# Patient Record
Sex: Female | Born: 1995 | Hispanic: Yes | Marital: Single | Smoking: Never smoker
Health system: Southern US, Community
[De-identification: ages and names within clinical notes are randomized; demographics above are authoritative.]

## PROBLEM LIST (undated history)

## (undated) DIAGNOSIS — Z87440 Personal history of urinary (tract) infections: Secondary | ICD-10-CM

## (undated) DIAGNOSIS — Z862 Personal history of diseases of the blood and blood-forming organs and certain disorders involving the immune mechanism: Secondary | ICD-10-CM

## (undated) DIAGNOSIS — G43909 Migraine, unspecified, not intractable, without status migrainosus: Secondary | ICD-10-CM

## (undated) DIAGNOSIS — Z8619 Personal history of other infectious and parasitic diseases: Secondary | ICD-10-CM

## (undated) HISTORY — DX: Personal history of other infectious and parasitic diseases: Z86.19

## (undated) HISTORY — DX: Personal history of urinary (tract) infections: Z87.440

## (undated) HISTORY — PX: OTHER SURGICAL HISTORY: SHX169

## (undated) HISTORY — DX: Migraine, unspecified, not intractable, without status migrainosus: G43.909

## (undated) HISTORY — DX: Personal history of diseases of the blood and blood-forming organs and certain disorders involving the immune mechanism: Z86.2

---

## 2016-09-01 NOTE — L&D Delivery Note (Signed)
Delivery Note At 8:03 PM a viable female was delivered via  (Presentation: ROA).  APGAR: 8, 9; weight pending.   Placenta status: Spontaneous, intact.  Cord: 3 vessels with the following complications: nuchal x1, delivered through with a somersault and easily reduced after delivery.   Anesthesia:  epidural Episiotomy:  n/a Lacerations:  Right labial, 1st degree perineal Suture Repair: 3.0 vicryl rapide Est. Blood Loss (mL):  300  Mom to postpartum.  Baby to Couplet care / Skin to Skin.  Len Blalock SNM 01/31/2017, 8:43 PM  Midwife attestation: I was gloved and present for delivery in its entirety and I agree with the above resident's note.  Julianne Handler, CNM 10:20 PM

## 2016-09-18 ENCOUNTER — Emergency Department
Admission: EM | Admit: 2016-09-18 | Discharge: 2016-09-18 | Disposition: A | Discharge status: Home / Self Care | Payer: Medicaid Other | Attending: Emergency Medicine | Admitting: Emergency Medicine

## 2016-09-18 ENCOUNTER — Encounter: Payer: Self-pay | Admitting: Emergency Medicine

## 2016-09-18 ENCOUNTER — Emergency Department: Payer: Medicaid Other

## 2016-09-18 DIAGNOSIS — Z3A20 20 weeks gestation of pregnancy: Secondary | ICD-10-CM | POA: Insufficient documentation

## 2016-09-18 DIAGNOSIS — J111 Influenza due to unidentified influenza virus with other respiratory manifestations: Secondary | ICD-10-CM | POA: Diagnosis not present

## 2016-09-18 DIAGNOSIS — O26892 Other specified pregnancy related conditions, second trimester: Secondary | ICD-10-CM | POA: Diagnosis present

## 2016-09-18 DIAGNOSIS — O98512 Other viral diseases complicating pregnancy, second trimester: Secondary | ICD-10-CM | POA: Insufficient documentation

## 2016-09-18 DIAGNOSIS — Z3492 Encounter for supervision of normal pregnancy, unspecified, second trimester: Secondary | ICD-10-CM

## 2016-09-18 LAB — COMPREHENSIVE METABOLIC PANEL
ALK PHOS: 60 U/L (ref 38–126)
ALT: 56 U/L — AB (ref 14–54)
AST: 47 U/L — ABNORMAL HIGH (ref 15–41)
Albumin: 3.7 g/dL (ref 3.5–5.0)
Anion gap: 7 (ref 5–15)
BUN: <5 mg/dL — ABNORMAL LOW (ref 6–20)
CALCIUM: 9 mg/dL (ref 8.9–10.3)
CHLORIDE: 103 mmol/L (ref 101–111)
CO2: 23 mmol/L (ref 22–32)
CREATININE: 0.56 mg/dL (ref 0.44–1.00)
Glucose, Bld: 98 mg/dL (ref 65–99)
Potassium: 4 mmol/L (ref 3.5–5.1)
Sodium: 133 mmol/L — ABNORMAL LOW (ref 135–145)
TOTAL PROTEIN: 7.8 g/dL (ref 6.5–8.1)
Total Bilirubin: <0.1 mg/dL — ABNORMAL LOW (ref 0.3–1.2)

## 2016-09-18 LAB — URINALYSIS, COMPLETE (UACMP) WITH MICROSCOPIC
Bacteria, UA: NONE SEEN
Bilirubin Urine: NEGATIVE
GLUCOSE, UA: NEGATIVE mg/dL
HGB URINE DIPSTICK: NEGATIVE
Ketones, ur: 5 mg/dL — AB
LEUKOCYTES UA: NEGATIVE
NITRITE: NEGATIVE
PROTEIN: NEGATIVE mg/dL
Specific Gravity, Urine: 1.014 (ref 1.005–1.030)
pH: 8 (ref 5.0–8.0)

## 2016-09-18 LAB — CBC
HCT: 35.8 % (ref 35.0–47.0)
Hemoglobin: 12.1 g/dL (ref 12.0–16.0)
MCH: 30 pg (ref 26.0–34.0)
MCHC: 33.8 g/dL (ref 32.0–36.0)
MCV: 88.7 fL (ref 80.0–100.0)
PLATELETS: 244 10*3/uL (ref 150–440)
RBC: 4.04 MIL/uL (ref 3.80–5.20)
RDW: 13.4 % (ref 11.5–14.5)
WBC: 7.4 10*3/uL (ref 3.6–11.0)

## 2016-09-18 LAB — INFLUENZA PANEL BY PCR (TYPE A & B)
Influenza A By PCR: POSITIVE — AB
Influenza B By PCR: NEGATIVE

## 2016-09-18 LAB — LIPASE, BLOOD: LIPASE: 24 U/L (ref 11–51)

## 2016-09-18 MED ORDER — SODIUM CHLORIDE 0.9 % IV SOLN
Freq: Once | INTRAVENOUS | Status: AC
  Administered 2016-09-18: 1000 mL via INTRAVENOUS

## 2016-09-18 MED ORDER — ACETAMINOPHEN 500 MG PO TABS
1000.0000 mg | ORAL_TABLET | Freq: Once | ORAL | Status: AC
  Administered 2016-09-18: 1000 mg via ORAL
  Filled 2016-09-18: qty 2

## 2016-09-18 MED ORDER — OSELTAMIVIR PHOSPHATE 75 MG PO CAPS: 75.0000 mg | ORAL_CAPSULE | Freq: Two times a day (BID) | ORAL | 0 refills | Status: AC

## 2016-09-18 MED ORDER — ACETAMINOPHEN 325 MG PO TABS: 650.0000 mg | ORAL_TABLET | ORAL | 2 refills | Status: DC | PRN

## 2016-09-18 MED ORDER — ONDANSETRON HCL 4 MG/2ML IJ SOLN
4.0000 mg | Freq: Once | INTRAMUSCULAR | Status: AC
  Administered 2016-09-18: 4 mg via INTRAVENOUS
  Filled 2016-09-18: qty 2

## 2016-09-18 MED ORDER — OSELTAMIVIR PHOSPHATE 75 MG PO CAPS
75.0000 mg | ORAL_CAPSULE | Freq: Once | ORAL | Status: AC
  Administered 2016-09-18: 75 mg via ORAL
  Filled 2016-09-18: qty 1

## 2016-09-18 MED ORDER — ONDANSETRON 4 MG PO TBDP: 4.0000 mg | ORAL_TABLET | Freq: Three times a day (TID) | ORAL | 0 refills | Status: DC | PRN

## 2016-09-18 NOTE — ED Provider Notes (Signed)
[facility] Emergency Department Provider Note        Time seen: ----------------------------------------- 9:35 AM on 09/18/2016 -----------------------------------------    I have reviewed the triage vital signs and the nursing notes.   HISTORY  Chief Complaint Fever    HPI Tracey Ray is a 21 y.o. female who presents to the ER with fevers, chills, body aches, headache, abdominal pain and vomiting that started yesterday. Patient denies any Case with the pregnancy. Patient states she's 4 and half months pregnant, nothing makes her symptoms better or worse. She denies any diarrhea or other complaints.   History reviewed. No pertinent past medical history.  There are no active problems to display for this patient.   History reviewed. No pertinent surgical history.  Allergies Patient has no known allergies.  Social History Social History  Substance Use Topics  . Smoking status: Never Smoker  . Smokeless tobacco: Never Used  . Alcohol use No    Review of Systems Constitutional: Positive fevers, chills, body aches Cardiovascular: Negative for chest pain. Respiratory: Negative for shortness of breath. Gastrointestinal:Positive for abdominal pain, vomiting Genitourinary: Negative for dysuria. Musculoskeletal: Negative for back pain. Skin: Negative for rash. Neurological: Positive for headache  10-point ROS otherwise negative.  ____________________________________________   PHYSICAL EXAM:  VITAL SIGNS: ED Triage Vitals  Enc Vitals Group     BP 09/18/16 0923 113/87     Pulse Rate 09/18/16 0923 (!) 140     Resp 09/18/16 0923 18     Temp 09/18/16 0923 (!) 101.2 F (38.4 C)     Temp Source 09/18/16 0923 Oral     SpO2 09/18/16 0923 98 %     Weight 09/18/16 0924 129 lb (58.5 kg)     Height 09/18/16 0924 5' (1.524 m)     Head Circumference --      Peak Flow --      Pain Score 09/18/16 0924 5     Pain Loc --      Pain  Edu? --      Excl. in [place]? --     Constitutional: Alert and oriented. No distress Eyes: Conjunctivae are normal. PERRL. Normal extraocular movements. ENT   Head: Normocephalic and atraumatic.   Nose: No congestion/rhinnorhea.   Mouth/Throat: Mucous membranes are moist.   Neck: No stridor. Cardiovascular: Rapid rate, regular rhythm. No murmurs, rubs, or gallops. Respiratory: Normal respiratory effort without tachypnea nor retractions. Breath sounds are clear and equal bilaterally. No wheezes/rales/rhonchi. Gastrointestinal: Soft and nontender. Normal bowel sounds Musculoskeletal: Nontender with normal range of motion in all extremities. No lower extremity tenderness nor edema. Neurologic:  Normal speech and language. No gross focal neurologic deficits are appreciated.  Skin:  Skin is warm, dry and intact. No rash noted. Psychiatric: Mood and affect are normal. Speech and behavior are normal.  ____________________________________________  ED COURSE:  Pertinent labs & imaging results that were available during my care of the patient were reviewed by me and considered in my medical decision making (see chart for details).  patient is in no distress, likely influenza. We will assess with labs and imaging. She'll receive IV fluids and antiemetics.  Procedures ____________________________________________   LABS (pertinent positives/negatives)  Labs Reviewed  COMPREHENSIVE METABOLIC PANEL - Abnormal; Notable for the following:       Result Value   Sodium 133 (*)    BUN <5 (*)    AST 47 (*)    ALT 56 (*)    Total Bilirubin <0.1 (*)  All other components within normal limits  URINALYSIS, COMPLETE (UACMP) WITH MICROSCOPIC - Abnormal; Notable for the following:    Color, Urine YELLOW (*)    APPearance CLEAR (*)    Ketones, ur 5 (*)    Squamous Epithelial / LPF 0-5 (*)    All other components within normal limits  INFLUENZA PANEL BY PCR (TYPE A & B) - Abnormal; Notable  for the following:    Influenza A By PCR POSITIVE (*)    All other components within normal limits  LIPASE, BLOOD  CBC    RADIOLOGY Images were viewed by me  Ultrasound OB limited IMPRESSION: Single live intrauterine gestation as above.  No acute abnormalities.  This exam is performed on an emergent basis and does not comprehensively evaluate fetal size, dating, or anatomy; follow-up complete OB US should be considered if further fetal assessment is warranted. ____________________________________________  FINAL ASSESSMENT AND PLAN  Influenza, second trimester pregnancy  Plan: Patient with labs and imaging as dictated above. Patient is in no acute distress, measuring right at 20 weeks and 6 days. She doesn't have vaginal bleeding or abdominal pain at this time. She does have flu symptoms with a normal ultrasound. She was started on Tamiflu, she is stable for outpatient follow-up.   Earleen Newport, MD   Note: This note was generated in part or whole with voice recognition software. Voice recognition is usually quite accurate but there are transcription errors that can and very often do occur. I apologize for any typographical errors that were not detected and corrected.     Earleen Newport, MD 09/18/16 1045

## 2016-09-18 NOTE — ED Notes (Signed)
Patient transported to Ultrasound 

## 2016-09-18 NOTE — ED Triage Notes (Signed)
Pt is 4.5 months pregnant. Has had fever, body aches, headache, and RLQ pain since yesterday. Denies complications with pregnancy. Last took tylenol last night.

## 2016-09-19 ENCOUNTER — Other Ambulatory Visit (HOSPITAL_COMMUNITY): Payer: Self-pay | Admitting: Primary Care

## 2016-09-19 ENCOUNTER — Other Ambulatory Visit: Payer: Self-pay | Admitting: Primary Care

## 2016-09-19 DIAGNOSIS — Z3482 Encounter for supervision of other normal pregnancy, second trimester: Secondary | ICD-10-CM

## 2016-10-01 LAB — OB RESULTS CONSOLE ABO/RH: RH Type: POSITIVE

## 2016-10-01 LAB — OB RESULTS CONSOLE HIV ANTIBODY (ROUTINE TESTING): HIV: NONREACTIVE

## 2016-10-01 LAB — OB RESULTS CONSOLE GC/CHLAMYDIA
CHLAMYDIA, DNA PROBE: NEGATIVE
GC PROBE AMP, GENITAL: NEGATIVE

## 2016-10-01 LAB — OB RESULTS CONSOLE RPR: RPR: NONREACTIVE

## 2016-10-01 LAB — OB RESULTS CONSOLE HGB/HCT, BLOOD
HCT: 37 %
Hemoglobin: 12.5 g/dL

## 2016-10-01 LAB — OB RESULTS CONSOLE PLATELET COUNT: Platelets: 278 10*3/uL

## 2016-10-01 LAB — OB RESULTS CONSOLE ANTIBODY SCREEN: Antibody Screen: NEGATIVE

## 2016-10-01 LAB — OB RESULTS CONSOLE HEPATITIS B SURFACE ANTIGEN: HEP B S AG: NEGATIVE

## 2016-10-01 LAB — OB RESULTS CONSOLE RUBELLA ANTIBODY, IGM: RUBELLA: NON-IMMUNE/NOT IMMUNE

## 2016-10-01 LAB — OB RESULTS CONSOLE VARICELLA ZOSTER ANTIBODY, IGG: VARICELLA IGG: IMMUNE

## 2016-10-02 ENCOUNTER — Other Ambulatory Visit (HOSPITAL_COMMUNITY): Payer: Self-pay | Admitting: Primary Care

## 2016-10-02 DIAGNOSIS — Z3A23 23 weeks gestation of pregnancy: Secondary | ICD-10-CM

## 2016-10-02 DIAGNOSIS — Z3689 Encounter for other specified antenatal screening: Secondary | ICD-10-CM

## 2016-10-10 ENCOUNTER — Ambulatory Visit (HOSPITAL_COMMUNITY)
Admission: RE | Admit: 2016-10-10 | Discharge: 2016-10-10 | Disposition: A | Discharge status: Home / Self Care | Payer: Medicaid Other | Source: Ambulatory Visit | Attending: Internal Medicine | Admitting: Internal Medicine

## 2016-10-10 ENCOUNTER — Other Ambulatory Visit (HOSPITAL_COMMUNITY): Payer: Self-pay | Admitting: Primary Care

## 2016-10-10 DIAGNOSIS — Z3A23 23 weeks gestation of pregnancy: Secondary | ICD-10-CM

## 2016-10-10 DIAGNOSIS — Z3689 Encounter for other specified antenatal screening: Secondary | ICD-10-CM

## 2016-10-10 DIAGNOSIS — Z363 Encounter for antenatal screening for malformations: Secondary | ICD-10-CM | POA: Diagnosis present

## 2016-11-17 ENCOUNTER — Encounter: Payer: Self-pay | Admitting: *Deleted

## 2016-11-17 ENCOUNTER — Ambulatory Visit (INDEPENDENT_AMBULATORY_CARE_PROVIDER_SITE_OTHER): Payer: Medicaid Other | Admitting: Family Medicine

## 2016-11-17 ENCOUNTER — Encounter: Payer: Self-pay | Admitting: Family Medicine

## 2016-11-17 DIAGNOSIS — Z3490 Encounter for supervision of normal pregnancy, unspecified, unspecified trimester: Secondary | ICD-10-CM

## 2016-11-17 DIAGNOSIS — Z23 Encounter for immunization: Secondary | ICD-10-CM

## 2016-11-17 DIAGNOSIS — Z2839 Other underimmunization status: Secondary | ICD-10-CM

## 2016-11-17 DIAGNOSIS — O0933 Supervision of pregnancy with insufficient antenatal care, third trimester: Secondary | ICD-10-CM

## 2016-11-17 DIAGNOSIS — O9989 Other specified diseases and conditions complicating pregnancy, childbirth and the puerperium: Secondary | ICD-10-CM

## 2016-11-17 DIAGNOSIS — O09899 Supervision of other high risk pregnancies, unspecified trimester: Secondary | ICD-10-CM | POA: Insufficient documentation

## 2016-11-17 DIAGNOSIS — Z283 Underimmunization status: Secondary | ICD-10-CM | POA: Insufficient documentation

## 2016-11-17 DIAGNOSIS — O0930 Supervision of pregnancy with insufficient antenatal care, unspecified trimester: Secondary | ICD-10-CM | POA: Insufficient documentation

## 2016-11-17 HISTORY — DX: Encounter for supervision of normal pregnancy, unspecified, unspecified trimester: Z34.90

## 2016-11-17 NOTE — Progress Notes (Signed)
New OB

## 2016-11-17 NOTE — Patient Instructions (Signed)
 Third Trimester of Pregnancy The third trimester is from week 28 through week 40 (months 7 through 9). The third trimester is a time when the unborn baby (fetus) is growing rapidly. At the end of the ninth month, the fetus is about 20 inches in length and weighs 6-10 pounds. Body changes during your third trimester Your body will continue to go through many changes during pregnancy. The changes vary from woman to woman. During the third trimester:  Your weight will continue to increase. You can expect to gain 25-35 pounds (11-16 kg) by the end of the pregnancy.  You may begin to get stretch marks on your hips, abdomen, and breasts.  You may urinate more often because the fetus is moving lower into your pelvis and pressing on your bladder.  You may develop or continue to have heartburn. This is caused by increased hormones that slow down muscles in the digestive tract.  You may develop or continue to have constipation because increased hormones slow digestion and cause the muscles that push waste through your intestines to relax.  You may develop hemorrhoids. These are swollen veins (varicose veins) in the rectum that can itch or be painful.  You may develop swollen, bulging veins (varicose veins) in your legs.  You may have increased body aches in the pelvis, back, or thighs. This is due to weight gain and increased hormones that are relaxing your joints.  You may have changes in your hair. These can include thickening of your hair, rapid growth, and changes in texture. Some women also have hair loss during or after pregnancy, or hair that feels dry or thin. Your hair will most likely return to normal after your baby is born.  Your breasts will continue to grow and they will continue to become tender. A yellow fluid (colostrum) may leak from your breasts. This is the first milk you are producing for your baby.  Your belly button may stick out.  You may notice more swelling in your  hands, face, or ankles.  You may have increased tingling or numbness in your hands, arms, and legs. The skin on your belly may also feel numb.  You may feel short of breath because of your expanding uterus.  You may have more problems sleeping. This can be caused by the size of your belly, increased need to urinate, and an increase in your body's metabolism.  You may notice the fetus "dropping," or moving lower in your abdomen (lightening).  You may have increased vaginal discharge.  You may notice your joints feel loose and you may have pain around your pelvic bone.  What to expect at prenatal visits You will have prenatal exams every 2 weeks until week 36. Then you will have weekly prenatal exams. During a routine prenatal visit:  You will be weighed to make sure you and the baby are growing normally.  Your blood pressure will be taken.  Your abdomen will be measured to track your baby's growth.  The fetal heartbeat will be listened to.  Any test results from the previous visit will be discussed.  You may have a cervical check near your due date to see if your cervix has softened or thinned (effaced).  You will be tested for Group B streptococcus. This happens between 35 and 37 weeks.  Your health care provider may ask you:  What your birth plan is.  How you are feeling.  If you are feeling the baby move.  If you have   had any abnormal symptoms, such as leaking fluid, bleeding, severe headaches, or abdominal cramping.  If you are using any tobacco products, including cigarettes, chewing tobacco, and electronic cigarettes.  If you have any questions.  Other tests or screenings that may be performed during your third trimester include:  Blood tests that check for low iron levels (anemia).  Fetal testing to check the health, activity level, and growth of the fetus. Testing is done if you have certain medical conditions or if there are problems during the  pregnancy.  Nonstress test (NST). This test checks the health of your baby to make sure there are no signs of problems, such as the baby not getting enough oxygen. During this test, a belt is placed around your belly. The baby is made to move, and its heart rate is monitored during movement.  What is false labor? False labor is a condition in which you feel small, irregular tightenings of the muscles in the womb (contractions) that usually go away with rest, changing position, or drinking water. These are called Braxton Hicks contractions. Contractions may last for hours, days, or even weeks before true labor sets in. If contractions come at regular intervals, become more frequent, increase in intensity, or become painful, you should see your health care provider. What are the signs of labor?  Abdominal cramps.  Regular contractions that start at 10 minutes apart and become stronger and more frequent with time.  Contractions that start on the top of the uterus and spread down to the lower abdomen and back.  Increased pelvic pressure and dull back pain.  A watery or bloody mucus discharge that comes from the vagina.  Leaking of amniotic fluid. This is also known as your "water breaking." It could be a slow trickle or a gush. Let your health care provider know if it has a color or strange odor. If you have any of these signs, call your health care provider right away, even if it is before your due date. Follow these instructions at home: Medicines  Follow your health care provider's instructions regarding medicine use. Specific medicines may be either safe or unsafe to take during pregnancy.  Take a prenatal vitamin that contains at least 600 micrograms (mcg) of folic acid.  If you develop constipation, try taking a stool softener if your health care provider approves. Eating and drinking  Eat a balanced diet that includes fresh fruits and vegetables, whole grains, good sources of protein  such as meat, eggs, or tofu, and low-fat dairy. Your health care provider will help you determine the amount of weight gain that is right for you.  Avoid raw meat and uncooked cheese. These carry germs that can cause birth defects in the baby.  If you have low calcium intake from food, talk to your health care provider about whether you should take a daily calcium supplement.  Eat four or five small meals rather than three large meals a day.  Limit foods that are high in fat and processed sugars, such as fried and sweet foods.  To prevent constipation: ? Drink enough fluid to keep your urine clear or pale yellow. ? Eat foods that are high in fiber, such as fresh fruits and vegetables, whole grains, and beans. Activity  Exercise only as directed by your health care provider. Most women can continue their usual exercise routine during pregnancy. Try to exercise for 30 minutes at least 5 days a week. Stop exercising if you experience uterine contractions.  Avoid   heavy lifting.  Do not exercise in extreme heat or humidity, or at high altitudes.  Wear low-heel, comfortable shoes.  Practice good posture.  You may continue to have sex unless your health care provider tells you otherwise. Relieving pain and discomfort  Take frequent breaks and rest with your legs elevated if you have leg cramps or low back pain.  Take warm sitz baths to soothe any pain or discomfort caused by hemorrhoids. Use hemorrhoid cream if your health care provider approves.  Wear a good support bra to prevent discomfort from breast tenderness.  If you develop varicose veins: ? Wear support pantyhose or compression stockings as told by your healthcare provider. ? Elevate your feet for 15 minutes, 3-4 times a day. Prenatal care  Write down your questions. Take them to your prenatal visits.  Keep all your prenatal visits as told by your health care provider. This is important. Safety  Wear your seat belt at  all times when driving.  Make a list of emergency phone numbers, including numbers for family, friends, the hospital, and police and fire departments. General instructions  Avoid cat litter boxes and soil used by cats. These carry germs that can cause birth defects in the baby. If you have a cat, ask someone to clean the litter box for you.  Do not travel far distances unless it is absolutely necessary and only with the approval of your health care provider.  Do not use hot tubs, steam rooms, or saunas.  Do not drink alcohol.  Do not use any products that contain nicotine or tobacco, such as cigarettes and e-cigarettes. If you need help quitting, ask your health care provider.  Do not use any medicinal herbs or unprescribed drugs. These chemicals affect the formation and growth of the baby.  Do not douche or use tampons or scented sanitary pads.  Do not cross your legs for long periods of time.  To prepare for the arrival of your baby: ? Take prenatal classes to understand, practice, and ask questions about labor and delivery. ? Make a trial run to the hospital. ? Visit the hospital and tour the maternity area. ? Arrange for maternity or paternity leave through employers. ? Arrange for family and friends to take care of pets while you are in the hospital. ? Purchase a rear-facing car seat and make sure you know how to install it in your car. ? Pack your hospital bag. ? Prepare the baby's nursery. Make sure to remove all pillows and stuffed animals from the baby's crib to prevent suffocation.  Visit your dentist if you have not gone during your pregnancy. Use a soft toothbrush to brush your teeth and be gentle when you floss. Contact a health care provider if:  You are unsure if you are in labor or if your water has broken.  You become dizzy.  You have mild pelvic cramps, pelvic pressure, or nagging pain in your abdominal area.  You have lower back pain.  You have persistent  nausea, vomiting, or diarrhea.  You have an unusual or bad smelling vaginal discharge.  You have pain when you urinate. Get help right away if:  Your water breaks before 37 weeks.  You have regular contractions less than 5 minutes apart before 37 weeks.  You have a fever.  You are leaking fluid from your vagina.  You have spotting or bleeding from your vagina.  You have severe abdominal pain or cramping.  You have rapid weight loss or weight   gain.  You have shortness of breath with chest pain.  You notice sudden or extreme swelling of your face, hands, ankles, feet, or legs.  Your baby makes fewer than 10 movements in 2 hours.  You have severe headaches that do not go away when you take medicine.  You have vision changes. Summary  The third trimester is from week 28 through week 40, months 7 through 9. The third trimester is a time when the unborn baby (fetus) is growing rapidly.  During the third trimester, your discomfort may increase as you and your baby continue to gain weight. You may have abdominal, leg, and back pain, sleeping problems, and an increased need to urinate.  During the third trimester your breasts will keep growing and they will continue to become tender. A yellow fluid (colostrum) may leak from your breasts. This is the first milk you are producing for your baby.  False labor is a condition in which you feel small, irregular tightenings of the muscles in the womb (contractions) that eventually go away. These are called Braxton Hicks contractions. Contractions may last for hours, days, or even weeks before true labor sets in.  Signs of labor can include: abdominal cramps; regular contractions that start at 10 minutes apart and become stronger and more frequent with time; watery or bloody mucus discharge that comes from the vagina; increased pelvic pressure and dull back pain; and leaking of amniotic fluid. This information is not intended to replace advice  given to you by your health care provider. Make sure you discuss any questions you have with your health care provider. Document Released: 08/12/2001 Document Revised: 01/24/2016 Document Reviewed: 10/19/2012 [employer] Interactive Patient Education  2017 [employer]   Breastfeeding Deciding to breastfeed is one of the best choices you can make for you and your baby. A change in hormones during pregnancy causes your breast tissue to grow and increases the number and size of your milk ducts. These hormones also allow proteins, sugars, and fats from your blood supply to make breast milk in your milk-producing glands. Hormones prevent breast milk from being released before your baby is born as well as prompt milk flow after birth. Once breastfeeding has begun, thoughts of your baby, as well as his or her sucking or crying, can stimulate the release of milk from your milk-producing glands. Benefits of breastfeeding For Your Baby  Your first milk (colostrum) helps your baby's digestive system function better.  There are antibodies in your milk that help your baby fight off infections.  Your baby has a lower incidence of asthma, allergies, and sudden infant death syndrome.  The nutrients in breast milk are better for your baby than infant formulas and are designed uniquely for your baby's needs.  Breast milk improves your baby's brain development.  Your baby is less likely to develop other conditions, such as childhood obesity, asthma, or type 2 diabetes mellitus.  For You  Breastfeeding helps to create a very special bond between you and your baby.  Breastfeeding is convenient. Breast milk is always available at the correct temperature and costs nothing.  Breastfeeding helps to burn calories and helps you lose the weight gained during pregnancy.  Breastfeeding makes your uterus contract to its prepregnancy size faster and slows bleeding (lochia) after you give birth.  Breastfeeding helps  to lower your risk of developing type 2 diabetes mellitus, osteoporosis, and breast or ovarian cancer later in life.  Signs that your baby is hungry Early Signs of Hunger    Increased alertness or activity.  Stretching.  Movement of the head from side to side.  Movement of the head and opening of the mouth when the corner of the mouth or cheek is stroked (rooting).  Increased sucking sounds, smacking lips, cooing, sighing, or squeaking.  Hand-to-mouth movements.  Increased sucking of fingers or hands.  Late Signs of Hunger  Fussing.  Intermittent crying.  Extreme Signs of Hunger Signs of extreme hunger will require calming and consoling before your baby will be able to breastfeed successfully. Do not wait for the following signs of extreme hunger to occur before you initiate breastfeeding:  Restlessness.  A loud, strong cry.  Screaming.  Breastfeeding basics Breastfeeding Initiation  Find a comfortable place to sit or lie down, with your neck and back well supported.  Place a pillow or rolled up blanket under your baby to bring him or her to the level of your breast (if you are seated). Nursing pillows are specially designed to help support your arms and your baby while you breastfeed.  Make sure that your baby's abdomen is facing your abdomen.  Gently massage your breast. With your fingertips, massage from your chest wall toward your nipple in a circular motion. This encourages milk flow. You may need to continue this action during the feeding if your milk flows slowly.  Support your breast with 4 fingers underneath and your thumb above your nipple. Make sure your fingers are well away from your nipple and your baby's mouth.  Stroke your baby's lips gently with your finger or nipple.  When your baby's mouth is open wide enough, quickly bring your baby to your breast, placing your entire nipple and as much of the colored area around your nipple (areola) as possible into  your baby's mouth. ? More areola should be visible above your baby's upper lip than below the lower lip. ? Your baby's tongue should be between his or her lower gum and your breast.  Ensure that your baby's mouth is correctly positioned around your nipple (latched). Your baby's lips should create a seal on your breast and be turned out (everted).  It is common for your baby to suck about 2-3 minutes in order to start the flow of breast milk.  Latching Teaching your baby how to latch on to your breast properly is very important. An improper latch can cause nipple pain and decreased milk supply for you and poor weight gain in your baby. Also, if your baby is not latched onto your nipple properly, he or she may swallow some air during feeding. This can make your baby fussy. Burping your baby when you switch breasts during the feeding can help to get rid of the air. However, teaching your baby to latch on properly is still the best way to prevent fussiness from swallowing air while breastfeeding. Signs that your baby has successfully latched on to your nipple:  Silent tugging or silent sucking, without causing you pain.  Swallowing heard between every 3-4 sucks.  Muscle movement above and in front of his or her ears while sucking.  Signs that your baby has not successfully latched on to nipple:  Sucking sounds or smacking sounds from your baby while breastfeeding.  Nipple pain.  If you think your baby has not latched on correctly, slip your finger into the corner of your baby's mouth to break the suction and place it between your baby's gums. Attempt breastfeeding initiation again. Signs of Successful Breastfeeding Signs from your baby:  A   gradual decrease in the number of sucks or complete cessation of sucking.  Falling asleep.  Relaxation of his or her body.  Retention of a small amount of milk in his or her mouth.  Letting go of your breast by himself or herself.  Signs from  you:  Breasts that have increased in firmness, weight, and size 1-3 hours after feeding.  Breasts that are softer immediately after breastfeeding.  Increased milk volume, as well as a change in milk consistency and color by the fifth day of breastfeeding.  Nipples that are not sore, cracked, or bleeding.  Signs That Your Baby is Getting Enough Milk  Wetting at least 1-2 diapers during the first 24 hours after birth.  Wetting at least 5-6 diapers every 24 hours for the first week after birth. The urine should be clear or pale yellow by 5 days after birth.  Wetting 6-8 diapers every 24 hours as your baby continues to grow and develop.  At least 3 stools in a 24-hour period by age 5 days. The stool should be soft and yellow.  At least 3 stools in a 24-hour period by age 7 days. The stool should be seedy and yellow.  No loss of weight greater than 10% of birth weight during the first 3 days of age.  Average weight gain of 4-7 ounces (113-198 g) per week after age 4 days.  Consistent daily weight gain by age 5 days, without weight loss after the age of 2 weeks.  After a feeding, your baby may spit up a small amount. This is common. Breastfeeding frequency and duration Frequent feeding will help you make more milk and can prevent sore nipples and breast engorgement. Breastfeed when you feel the need to reduce the fullness of your breasts or when your baby shows signs of hunger. This is called "breastfeeding on demand." Avoid introducing a pacifier to your baby while you are working to establish breastfeeding (the first 4-6 weeks after your baby is born). After this time you may choose to use a pacifier. Research has shown that pacifier use during the first year of a baby's life decreases the risk of sudden infant death syndrome (SIDS). Allow your baby to feed on each breast as long as he or she wants. Breastfeed until your baby is finished feeding. When your baby unlatches or falls asleep  while feeding from the first breast, offer the second breast. Because newborns are often sleepy in the first few weeks of life, you may need to awaken your baby to get him or her to feed. Breastfeeding times will vary from baby to baby. However, the following rules can serve as a guide to help you ensure that your baby is properly fed:  Newborns (babies 4 weeks of age or younger) may breastfeed every 1-3 hours.  Newborns should not go longer than 3 hours during the day or 5 hours during the night without breastfeeding.  You should breastfeed your baby a minimum of 8 times in a 24-hour period until you begin to introduce solid foods to your baby at around 6 months of age.  Breast milk pumping Pumping and storing breast milk allows you to ensure that your baby is exclusively fed your breast milk, even at times when you are unable to breastfeed. This is especially important if you are going back to work while you are still breastfeeding or when you are not able to be present during feedings. Your lactation consultant can give you guidelines on how   long it is safe to store breast milk. A breast pump is a machine that allows you to pump milk from your breast into a sterile bottle. The pumped breast milk can then be stored in a refrigerator or freezer. Some breast pumps are operated by hand, while others use electricity. Ask your lactation consultant which type will work best for you. Breast pumps can be purchased, but some hospitals and breastfeeding support groups lease breast pumps on a monthly basis. A lactation consultant can teach you how to hand express breast milk, if you prefer not to use a pump. Caring for your breasts while you breastfeed Nipples can become dry, cracked, and sore while breastfeeding. The following recommendations can help keep your breasts moisturized and healthy:  Avoid using soap on your nipples.  Wear a supportive bra. Although not required, special nursing bras and tank  tops are designed to allow access to your breasts for breastfeeding without taking off your entire bra or top. Avoid wearing underwire-style bras or extremely tight bras.  Air dry your nipples for 3-4minutes after each feeding.  Use only cotton bra pads to absorb leaked breast milk. Leaking of breast milk between feedings is normal.  Use lanolin on your nipples after breastfeeding. Lanolin helps to maintain your skin's normal moisture barrier. If you use pure lanolin, you do not need to wash it off before feeding your baby again. Pure lanolin is not toxic to your baby. You may also hand express a few drops of breast milk and gently massage that milk into your nipples and allow the milk to air dry.  In the first few weeks after giving birth, some women experience extremely full breasts (engorgement). Engorgement can make your breasts feel heavy, warm, and tender to the touch. Engorgement peaks within 3-5 days after you give birth. The following recommendations can help ease engorgement:  Completely empty your breasts while breastfeeding or pumping. You may want to start by applying warm, moist heat (in the shower or with warm water-soaked hand towels) just before feeding or pumping. This increases circulation and helps the milk flow. If your baby does not completely empty your breasts while breastfeeding, pump any extra milk after he or she is finished.  Wear a snug bra (nursing or regular) or tank top for 1-2 days to signal your body to slightly decrease milk production.  Apply ice packs to your breasts, unless this is too uncomfortable for you.  Make sure that your baby is latched on and positioned properly while breastfeeding.  If engorgement persists after 48 hours of following these recommendations, contact your health care provider or a lactation consultant. Overall health care recommendations while breastfeeding  Eat healthy foods. Alternate between meals and snacks, eating 3 of each per  day. Because what you eat affects your breast milk, some of the foods may make your baby more irritable than usual. Avoid eating these foods if you are sure that they are negatively affecting your baby.  Drink milk, fruit juice, and water to satisfy your thirst (about 10 glasses a day).  Rest often, relax, and continue to take your prenatal vitamins to prevent fatigue, stress, and anemia.  Continue breast self-awareness checks.  Avoid chewing and smoking tobacco. Chemicals from cigarettes that pass into breast milk and exposure to secondhand smoke may harm your baby.  Avoid alcohol and drug use, including marijuana. Some medicines that may be harmful to your baby can pass through breast milk. It is important to ask your health care   provider before taking any medicine, including all over-the-counter and prescription medicine as well as vitamin and herbal supplements. It is possible to become pregnant while breastfeeding. If birth control is desired, ask your health care provider about options that will be safe for your baby. Contact a health care provider if:  You feel like you want to stop breastfeeding or have become frustrated with breastfeeding.  You have painful breasts or nipples.  Your nipples are cracked or bleeding.  Your breasts are red, tender, or warm.  You have a swollen area on either breast.  You have a fever or chills.  You have nausea or vomiting.  You have drainage other than breast milk from your nipples.  Your breasts do not become full before feedings by the fifth day after you give birth.  You feel sad and depressed.  Your baby is too sleepy to eat well.  Your baby is having trouble sleeping.  Your baby is wetting less than 3 diapers in a 24-hour period.  Your baby has less than 3 stools in a 24-hour period.  Your baby's skin or the white part of his or her eyes becomes yellow.  Your baby is not gaining weight by 5 days of age. Get help right away  if:  Your baby is overly tired (lethargic) and does not want to wake up and feed.  Your baby develops an unexplained fever. This information is not intended to replace advice given to you by your health care provider. Make sure you discuss any questions you have with your health care provider. Document Released: 08/18/2005 Document Revised: 01/30/2016 Document Reviewed: 02/09/2013 Elsevier Interactive Patient Education  2017 [employer] Inc[employer]  

## 2016-11-17 NOTE — Progress Notes (Signed)
   PRENATAL VISIT NOTE  Subjective:  Tracey Ray is a 21 y.o. G1P0 at 66w1dbeing seen today for transferring prenatal care from [facility].  She is currently monitored for the following issues for this low-risk pregnancy and has Supervision of normal pregnancy, antepartum and Rubella non-immune status, antepartum on her problem list.  Patient reports no complaints.  Contractions: Not present. Vag. Bleeding: None.  Movement: Present. Denies leaking of fluid.   The following portions of the patient's history were reviewed and updated as appropriate: allergies, current medications, past family history, past medical history, past social history, past surgical history and problem list. Problem list updated.  Objective:   Vitals:   11/17/16 1503  BP: 107/72  Pulse: 97  Weight: 144 lb (65.3 kg)    Fetal Status: Fetal Heart Rate (bpm): 160 Fundal Height: 29 cm Movement: Present     General:  Alert, oriented and cooperative. Patient is in no acute distress.  Skin: Skin is warm and dry. No rash noted.   Cardiovascular: Normal heart rate noted  Respiratory: Normal respiratory effort, no problems with respiration noted  Abdomen: Soft, gravid, appropriate for gestational age. Pain/Pressure: Absent     Pelvic:  Cervical exam deferred        Extremities: Normal range of motion.  Edema: Trace  Mental Status: Normal mood and affect. Normal behavior. Normal judgment and thought content.   Assessment and Plan:  Pregnancy: G1P0 at 248w1d1. Encounter for supervision of normal pregnancy, antepartum, unspecified gravidity States she has had 28 wk labs. - Tdap vaccine greater than or equal to 7yo IM  2. Rubella non-immune status, antepartum Needs MMR pp  Preterm labor symptoms and general obstetric precautions including but not limited to vaginal bleeding, contractions, leaking of fluid and fetal movement were reviewed in detail with the patient. Please refer to After  Visit Summary for other counseling recommendations.  Return in 2 weeks (on 12/01/2016).   TaDonnamae JudeMD

## 2016-11-19 ENCOUNTER — Encounter: Payer: Self-pay | Admitting: *Deleted

## 2016-12-01 ENCOUNTER — Encounter: Payer: Medicaid Other | Admitting: Obstetrics and Gynecology

## 2016-12-03 ENCOUNTER — Ambulatory Visit (INDEPENDENT_AMBULATORY_CARE_PROVIDER_SITE_OTHER): Payer: Medicaid Other | Admitting: Obstetrics & Gynecology

## 2016-12-03 VITALS — BP 102/69 | HR 96 | Wt 148.0 lb

## 2016-12-03 DIAGNOSIS — Z3403 Encounter for supervision of normal first pregnancy, third trimester: Secondary | ICD-10-CM

## 2016-12-03 DIAGNOSIS — Z3490 Encounter for supervision of normal pregnancy, unspecified, unspecified trimester: Secondary | ICD-10-CM

## 2016-12-03 NOTE — Progress Notes (Signed)
   PRENATAL VISIT NOTE  Subjective:  Tracey Ray is a 21 y.o. G1P0 at [redacted]w[redacted]d being seen today for ongoing prenatal care.  She is currently monitored for the following issues for this low-risk pregnancy and has Supervision of normal pregnancy, antepartum and Rubella non-immune status, antepartum on her problem list.  Patient reports no complaints.  Contractions: Not present. Vag. Bleeding: None.  Movement: Present. Denies leaking of fluid.   The following portions of the patient's history were reviewed and updated as appropriate: allergies, current medications, past family history, past medical history, past social history, past surgical history and problem list. Problem list updated.  Objective:   Vitals:   12/03/16 1035  BP: 102/69  Pulse: 96  Weight: 148 lb (67.1 kg)    Fetal Status: Fetal Heart Rate (bpm): 145   Movement: Present     General:  Alert, oriented and cooperative. Patient is in no acute distress.  Skin: Skin is warm and dry. No rash noted.   Cardiovascular: Normal heart rate noted  Respiratory: Normal respiratory effort, no problems with respiration noted  Abdomen: Soft, gravid, appropriate for gestational age. Pain/Pressure: Absent     Pelvic:  Cervical exam deferred        Extremities: Normal range of motion.  Edema: None  Mental Status: Normal mood and affect. Normal behavior. Normal judgment and thought content.   Assessment and Plan:  Pregnancy: G1P0 at [redacted]w[redacted]d  1. Encounter for supervision of normal pregnancy, antepartum, unspecified gravidity   Preterm labor symptoms and general obstetric precautions including but not limited to vaginal bleeding, contractions, leaking of fluid and fetal movement were reviewed in detail with the patient. Please refer to After Visit Summary for other counseling recommendations.  No Follow-up on file.   Emily Filbert, MD

## 2016-12-16 ENCOUNTER — Ambulatory Visit (INDEPENDENT_AMBULATORY_CARE_PROVIDER_SITE_OTHER): Payer: Medicaid Other | Admitting: Obstetrics and Gynecology

## 2016-12-16 VITALS — BP 102/71 | HR 78 | Wt 150.0 lb

## 2016-12-16 DIAGNOSIS — O09899 Supervision of other high risk pregnancies, unspecified trimester: Secondary | ICD-10-CM

## 2016-12-16 DIAGNOSIS — Z3403 Encounter for supervision of normal first pregnancy, third trimester: Secondary | ICD-10-CM

## 2016-12-16 DIAGNOSIS — Z3400 Encounter for supervision of normal first pregnancy, unspecified trimester: Secondary | ICD-10-CM

## 2016-12-16 DIAGNOSIS — Z283 Underimmunization status: Secondary | ICD-10-CM

## 2016-12-16 DIAGNOSIS — O9989 Other specified diseases and conditions complicating pregnancy, childbirth and the puerperium: Secondary | ICD-10-CM

## 2016-12-16 MED ORDER — COMPLETENATE 29-1 MG PO CHEW: 1.0000 | CHEWABLE_TABLET | Freq: Every day | ORAL | 12 refills | Status: DC

## 2016-12-16 NOTE — Progress Notes (Signed)
   PRENATAL VISIT NOTE  Subjective:  Tracey Ray is a 21 y.o. G1P0 at [redacted]w[redacted]d being seen today for ongoing prenatal care.  She is currently monitored for the following issues for this low-risk pregnancy and has Supervision of normal pregnancy, antepartum and Rubella non-immune status, antepartum on her problem list.  Patient reports seasonal allergies.  Contractions: Not present. Vag. Bleeding: None.  Movement: Present. Denies leaking of fluid.   The following portions of the patient's history were reviewed and updated as appropriate: allergies, current medications, past family history, past medical history, past social history, past surgical history and problem list. Problem list updated.  Objective:   Vitals:   12/16/16 1041  BP: 102/71  Pulse: 78  Weight: 150 lb (68 kg)    Fetal Status: Fetal Heart Rate (bpm): 143 Fundal Height: 33 cm Movement: Present     General:  Alert, oriented and cooperative. Patient is in no acute distress.  Skin: Skin is warm and dry. No rash noted.   Cardiovascular: Normal heart rate noted  Respiratory: Normal respiratory effort, no problems with respiration noted  Abdomen: Soft, gravid, appropriate for gestational age. Pain/Pressure: Present     Pelvic:  Cervical exam deferred        Extremities: Normal range of motion.  Edema: Trace  Mental Status: Normal mood and affect. Normal behavior. Normal judgment and thought content.   Assessment and Plan:  Pregnancy: G1P0 at [redacted]w[redacted]d  1. Supervision of normal first pregnancy, antepartum Advised patient to use OTC allergy medications- Zytec, Allegra Patient has yet to decide on pediatrician She is interested in circumcision and understands that there is a fee for the procedure  2. Rubella non-immune status, antepartum Will offer pp  Preterm labor symptoms and general obstetric precautions including but not limited to vaginal bleeding, contractions, leaking of fluid and fetal movement were  reviewed in detail with the patient. Please refer to After Visit Summary for other counseling recommendations.  Return in about 2 weeks (around 12/30/2016) for [place].   Mora Bellman, MD

## 2016-12-31 ENCOUNTER — Encounter: Payer: Medicaid Other | Admitting: Family Medicine

## 2016-12-31 ENCOUNTER — Ambulatory Visit (INDEPENDENT_AMBULATORY_CARE_PROVIDER_SITE_OTHER): Payer: Medicaid Other | Admitting: Family Medicine

## 2016-12-31 ENCOUNTER — Other Ambulatory Visit (HOSPITAL_COMMUNITY)
Admission: RE | Admit: 2016-12-31 | Discharge: 2016-12-31 | Disposition: A | Discharge status: Home / Self Care | Payer: Medicaid Other | Source: Ambulatory Visit | Attending: Family Medicine | Admitting: Family Medicine

## 2016-12-31 VITALS — BP 128/76 | HR 125 | Wt 151.0 lb

## 2016-12-31 DIAGNOSIS — Z3403 Encounter for supervision of normal first pregnancy, third trimester: Secondary | ICD-10-CM | POA: Diagnosis present

## 2016-12-31 LAB — OB RESULTS CONSOLE GBS: STREP GROUP B AG: NEGATIVE

## 2016-12-31 NOTE — Patient Instructions (Signed)
Pain Relief During Labor and Delivery Many things can cause pain during labor and delivery, including:  Pressure on bones and ligaments due to the baby moving through the pelvis.  Stretching of tissues due to the baby moving through the birth canal.  Muscle tension due to anxiety or nervousness.  The uterus tightening (contracting) and relaxing to help move the baby. There are many ways to deal with the pain of labor and delivery. They include:  Taking prenatal classes. Taking these classes helps you know what to expect during your baby's birth. What you learn will increase your confidence and decrease your anxiety.  Practicing relaxation techniques or doing relaxing activities, such as:  Focused breathing.  Meditation.  Visualization.  Aroma therapy.  Listening to your favorite music.  Hypnosis.  Taking a warm shower or bath (hydrotherapy). This may:  Provide comfort and relaxation.  Lessen your perception of pain.  Decrease the amount of pain medicine needed.  Decrease the length of labor.  Getting a massage or counterpressure on your back.  Applying warm packs or ice packs.  Changing positions often, moving around, or using a birthing ball.  Getting:  Pain medicine through an IV or injection into a muscle.  Pain medicine inserted into your spinal column.  Injections of sterile water just under the skin on your lower back (intradermal injections).  Laughing gas (nitrous oxide). Discuss your pain control options with your health care provider during your prenatal visits. Explore the options offered by your hospital or birth center. What kinds of medicine are available? There are two kinds of medicines that can be used to relieve pain during labor and delivery:  Analgesics. These medicines decrease pain without causing you to lose feeling or the ability to move your muscles.  Anesthetics. These medicines block feeling in the body and can decrease your  ability to move freely. Both of these kinds of medicine can cause minor side effects, such as nausea, trouble concentrating, and sleepiness. They can also decrease the baby's heart rate before birth and affect the baby's breathing rate after birth. For this reason, health care providers are careful about when and how much medicine is given. What are specific medicines and procedures that provide pain relief?  Local Anesthetics  Local anesthetics are used to numb a small area of the body. They may be used along with another kind of anesthetic or used to numb the nerves of the vagina, cervix, and perineum during the second stage of labor. General Anesthetics  General anesthetics cause you to lose consciousness so you do not feel pain. They are usually only used for an emergency cesarean delivery. General anesthetics are given through an IV tube and a mask. Pudendal Block  A pudendal block is a form of local anesthetic. It may be used to relieve the pain associated with pushing or stretching of the perineum at the time of delivery or to further numb the perineum. A pudendal block is done by injecting numbing medicine through the vaginal wall into a nerve in the pelvis. Epidural Analgesia  Epidural analgesia is given through a flexible IV catheter that is inserted into the lower back. Numbing medicine is delivered continuously to the area near your spinal column nerves (epidural space). After having this type of analgesia, you may be able to move your legs but you most likely will not be able to walk. Depending on the amount of medicine given, you may lose all feeling in the lower half of your body, or  you may retain some level of sensation, including the urge to push. Epidural analgesia can be used to provide pain relief for a vaginal birth. Spinal Block  A spinal block is similar to epidural analgesia, but the medicine is injected into the spinal fluid instead of the epidural space. A spinal block is only  given once. It starts to relieve pain quickly, but the pain relief lasts only 1-6 hours. Spinal blocks can be used for cesarean deliveries. Combined Spinal-Epidural (CSE) Block  A CSE block combines the effects of a spinal block and epidural analgesia. The spinal block works quickly to block all pain. The epidural analgesia provides continuous pain relief, even after the effects of the spinal block have worn off. This information is not intended to replace advice given to you by your health care provider. Make sure you discuss any questions you have with your health care provider. Document Released: 12/04/2008 Document Revised: 01/25/2016 Document Reviewed: 01/09/2016 Elsevier Interactive Patient Education  2017 [employer].

## 2016-12-31 NOTE — Progress Notes (Signed)
   PRENATAL VISIT NOTE  Subjective:  Tracey Ray is a 21 y.o. G1P0 at 13w3dbeing seen today for ongoing prenatal care.  She is currently monitored for the following issues for this low-risk pregnancy and has Supervision of normal pregnancy, antepartum and Rubella non-immune status, antepartum on her problem list.  Patient reports no complaints.  Contractions: Not present. Vag. Bleeding: None.  Movement: Present. Denies leaking of fluid.   HR elevated- denies lightheadedness. Reports occasional dizziness with changes in position. Denies SOB.   The following portions of the patient's history were reviewed and updated as appropriate: allergies, current medications, past family history, past medical history, past social history, past surgical history and problem list. Problem list updated.  Objective:   Vitals:   12/31/16 1048  BP: 128/76  Pulse: (!) 125  Weight: 151 lb (68.5 kg)    Fetal Status: Fetal Heart Rate (bpm): 146   Movement: Present     General:  Alert, oriented and cooperative. Patient is in no acute distress.  Skin: Skin is warm and dry. No rash noted.   Cardiovascular: Normal heart rate noted  Respiratory: Normal respiratory effort, no problems with respiration noted  Abdomen: Soft, gravid, appropriate for gestational age. Pain/Pressure: Present     Pelvic:  Cervical exam deferred        Extremities: Normal range of motion.  Edema: Trace  Mental Status: Normal mood and affect. Normal behavior. Normal judgment and thought content.   Assessment and Plan:  Pregnancy: G1P0 at 38w3d1. Encounter for supervision of normal first pregnancy in third trimester - Routine care - Culture, beta strep (group b only) - Cervicovaginal ancillary only  2. Rubella- give MMR pp  3. Maternal tachycardia - Initially 125, repeat 110. ausculatory exam wnl - Recommended increasing water intake  - Reviewed return precautions, patient voiced udnerstandning  Preterm  labor symptoms and general obstetric precautions including but not limited to vaginal bleeding, contractions, leaking of fluid and fetal movement were reviewed in detail with the patient. Please refer to After Visit Summary for other counseling recommendations .  Return in about 1 week (around 01/07/2017) for Routine prenatal care.   KiCaren MacadamMD

## 2017-01-02 LAB — CERVICOVAGINAL ANCILLARY ONLY
CHLAMYDIA, DNA PROBE: NEGATIVE
NEISSERIA GONORRHEA: NEGATIVE

## 2017-01-04 LAB — CULTURE, BETA STREP (GROUP B ONLY): STREP GP B CULTURE: NEGATIVE

## 2017-01-09 ENCOUNTER — Encounter: Payer: Self-pay | Admitting: Obstetrics and Gynecology

## 2017-01-09 ENCOUNTER — Ambulatory Visit (INDEPENDENT_AMBULATORY_CARE_PROVIDER_SITE_OTHER): Payer: Medicaid Other | Admitting: Obstetrics and Gynecology

## 2017-01-09 VITALS — BP 104/68 | HR 90 | Wt 153.0 lb

## 2017-01-09 DIAGNOSIS — Z2839 Other underimmunization status: Secondary | ICD-10-CM

## 2017-01-09 DIAGNOSIS — Z3400 Encounter for supervision of normal first pregnancy, unspecified trimester: Secondary | ICD-10-CM

## 2017-01-09 DIAGNOSIS — O9989 Other specified diseases and conditions complicating pregnancy, childbirth and the puerperium: Secondary | ICD-10-CM

## 2017-01-09 DIAGNOSIS — Z283 Underimmunization status: Secondary | ICD-10-CM

## 2017-01-09 DIAGNOSIS — Z3403 Encounter for supervision of normal first pregnancy, third trimester: Secondary | ICD-10-CM

## 2017-01-09 NOTE — Progress Notes (Signed)
   PRENATAL VISIT NOTE  Subjective:  Tracey Ray is a 21 y.o. G1P0 at [redacted]w[redacted]d being seen today for ongoing prenatal care.  She is currently monitored for the following issues for this low-risk pregnancy and has Supervision of normal pregnancy, antepartum and Rubella non-immune status, antepartum on her problem list.  Patient reports no complaints.  Contractions: Not present. Vag. Bleeding: None.  Movement: Present. Denies leaking of fluid.   The following portions of the patient's history were reviewed and updated as appropriate: allergies, current medications, past family history, past medical history, past social history, past surgical history and problem list. Problem list updated.  Objective:   Vitals:   01/09/17 1111  BP: 104/68  Pulse: 90  Weight: 153 lb (69.4 kg)    Fetal Status: Fetal Heart Rate (bpm): 154 Fundal Height: 36 cm Movement: Present     General:  Alert, oriented and cooperative. Patient is in no acute distress.  Skin: Skin is warm and dry. No rash noted.   Cardiovascular: Normal heart rate noted  Respiratory: Normal respiratory effort, no problems with respiration noted  Abdomen: Soft, gravid, appropriate for gestational age. Pain/Pressure: Present     Pelvic:  Cervical exam deferred        Extremities: Normal range of motion.  Edema: Mild pitting, slight indentation  Mental Status: Normal mood and affect. Normal behavior. Normal judgment and thought content.   Assessment and Plan:  Pregnancy: G1P0 at [redacted]w[redacted]d  1. Supervision of normal first pregnancy, antepartum Patient is doing well without complaints Answered questions regarding delivery and hospitalization Reviewed neg culture results Considering Nexplanon  2. Rubella non-immune status, antepartum Will offer pp   Preterm labor symptoms and general obstetric precautions including but not limited to vaginal bleeding, contractions, leaking of fluid and fetal movement were reviewed in detail  with the patient. Please refer to After Visit Summary for other counseling recommendations.  Return in about 1 week (around 01/16/2017) for [place].   Bashir Marchetti, Vickii Chafe, MD

## 2017-01-14 ENCOUNTER — Ambulatory Visit (INDEPENDENT_AMBULATORY_CARE_PROVIDER_SITE_OTHER): Payer: Medicaid Other | Admitting: Family Medicine

## 2017-01-14 VITALS — BP 107/73 | HR 88 | Wt 156.0 lb

## 2017-01-14 DIAGNOSIS — Z3400 Encounter for supervision of normal first pregnancy, unspecified trimester: Secondary | ICD-10-CM

## 2017-01-14 DIAGNOSIS — Z3403 Encounter for supervision of normal first pregnancy, third trimester: Secondary | ICD-10-CM

## 2017-01-14 DIAGNOSIS — O9989 Other specified diseases and conditions complicating pregnancy, childbirth and the puerperium: Secondary | ICD-10-CM

## 2017-01-14 DIAGNOSIS — Z283 Underimmunization status: Secondary | ICD-10-CM

## 2017-01-14 DIAGNOSIS — O09899 Supervision of other high risk pregnancies, unspecified trimester: Secondary | ICD-10-CM

## 2017-01-14 NOTE — Progress Notes (Signed)
   PRENATAL VISIT NOTE  Subjective:  Tracey Ray is a 21 y.o. G1P0 at 59w3dbeing seen today for ongoing prenatal care.  She is currently monitored for the following issues for this low-risk pregnancy and has Supervision of normal pregnancy, antepartum and Rubella non-immune status, antepartum on her problem list.  Patient reports cramping.  Contractions: Not present. Vag. Bleeding: None.  Movement: Present. Denies leaking of fluid.   The following portions of the patient's history were reviewed and updated as appropriate: allergies, current medications, past family history, past medical history, past social history, past surgical history and problem list. Problem list updated.  Objective:   Vitals:   01/14/17 1616  BP: 107/73  Pulse: 88  Weight: 156 lb (70.8 kg)    Fetal Status: Fetal Heart Rate (bpm): 139 Fundal Height: 37 cm Movement: Present     General:  Alert, oriented and cooperative. Patient is in no acute distress.  Skin: Skin is warm and dry. No rash noted.   Cardiovascular: Normal heart rate noted  Respiratory: Normal respiratory effort, no problems with respiration noted  Abdomen: Soft, gravid, appropriate for gestational age. Pain/Pressure: Present     Pelvic:  Cervical exam performed        Extremities: Normal range of motion.  Edema: Trace  Mental Status: Normal mood and affect. Normal behavior. Normal judgment and thought content.   Assessment and Plan:  Pregnancy: G1P0 at 349w3d1. Supervision of normal first pregnancy, antepartum UTD Reviewed labor precautions Discussed IOl at 41 weeks if she does not deliver before  2. Rubella non-immune status, antepartum MMR pp  Term labor symptoms and general obstetric precautions including but not limited to vaginal bleeding, contractions, leaking of fluid and fetal movement were reviewed in detail with the patient. Please refer to After Visit Summary for other counseling recommendations.  Return in  about 1 week (around 01/21/2017) for Routine prenatal care.   KiCaren MacadamMD

## 2017-01-14 NOTE — Patient Instructions (Signed)
Pain Relief During Labor and Delivery Many things can cause pain during labor and delivery, including:  Pressure on bones and ligaments due to the baby moving through the pelvis.  Stretching of tissues due to the baby moving through the birth canal.  Muscle tension due to anxiety or nervousness.  The uterus tightening (contracting) and relaxing to help move the baby. There are many ways to deal with the pain of labor and delivery. They include:  Taking prenatal classes. Taking these classes helps you know what to expect during your baby's birth. What you learn will increase your confidence and decrease your anxiety.  Practicing relaxation techniques or doing relaxing activities, such as:  Focused breathing.  Meditation.  Visualization.  Aroma therapy.  Listening to your favorite music.  Hypnosis.  Taking a warm shower or bath (hydrotherapy). This may:  Provide comfort and relaxation.  Lessen your perception of pain.  Decrease the amount of pain medicine needed.  Decrease the length of labor.  Getting a massage or counterpressure on your back.  Applying warm packs or ice packs.  Changing positions often, moving around, or using a birthing ball.  Getting:  Pain medicine through an IV or injection into a muscle.  Pain medicine inserted into your spinal column.  Injections of sterile water just under the skin on your lower back (intradermal injections).  Laughing gas (nitrous oxide). Discuss your pain control options with your health care provider during your prenatal visits. Explore the options offered by your hospital or birth center. What kinds of medicine are available? There are two kinds of medicines that can be used to relieve pain during labor and delivery:  Analgesics. These medicines decrease pain without causing you to lose feeling or the ability to move your muscles.  Anesthetics. These medicines block feeling in the body and can decrease your  ability to move freely. Both of these kinds of medicine can cause minor side effects, such as nausea, trouble concentrating, and sleepiness. They can also decrease the baby's heart rate before birth and affect the baby's breathing rate after birth. For this reason, health care providers are careful about when and how much medicine is given. What are specific medicines and procedures that provide pain relief?  Local Anesthetics  Local anesthetics are used to numb a small area of the body. They may be used along with another kind of anesthetic or used to numb the nerves of the vagina, cervix, and perineum during the second stage of labor. General Anesthetics  General anesthetics cause you to lose consciousness so you do not feel pain. They are usually only used for an emergency cesarean delivery. General anesthetics are given through an IV tube and a mask. Pudendal Block  A pudendal block is a form of local anesthetic. It may be used to relieve the pain associated with pushing or stretching of the perineum at the time of delivery or to further numb the perineum. A pudendal block is done by injecting numbing medicine through the vaginal wall into a nerve in the pelvis. Epidural Analgesia  Epidural analgesia is given through a flexible IV catheter that is inserted into the lower back. Numbing medicine is delivered continuously to the area near your spinal column nerves (epidural space). After having this type of analgesia, you may be able to move your legs but you most likely will not be able to walk. Depending on the amount of medicine given, you may lose all feeling in the lower half of your body, or  you may retain some level of sensation, including the urge to push. Epidural analgesia can be used to provide pain relief for a vaginal birth. Spinal Block  A spinal block is similar to epidural analgesia, but the medicine is injected into the spinal fluid instead of the epidural space. A spinal block is only  given once. It starts to relieve pain quickly, but the pain relief lasts only 1-6 hours. Spinal blocks can be used for cesarean deliveries. Combined Spinal-Epidural (CSE) Block  A CSE block combines the effects of a spinal block and epidural analgesia. The spinal block works quickly to block all pain. The epidural analgesia provides continuous pain relief, even after the effects of the spinal block have worn off. This information is not intended to replace advice given to you by your health care provider. Make sure you discuss any questions you have with your health care provider. Document Released: 12/04/2008 Document Revised: 01/25/2016 Document Reviewed: 01/09/2016 Elsevier Interactive Patient Education  2017 [employer].

## 2017-01-22 ENCOUNTER — Ambulatory Visit (INDEPENDENT_AMBULATORY_CARE_PROVIDER_SITE_OTHER): Payer: Medicaid Other | Admitting: Obstetrics and Gynecology

## 2017-01-22 ENCOUNTER — Encounter: Payer: Medicaid Other | Admitting: Obstetrics and Gynecology

## 2017-01-22 VITALS — BP 111/74 | HR 102 | Wt 159.0 lb

## 2017-01-22 DIAGNOSIS — Z3403 Encounter for supervision of normal first pregnancy, third trimester: Secondary | ICD-10-CM

## 2017-01-22 DIAGNOSIS — Z283 Underimmunization status: Secondary | ICD-10-CM

## 2017-01-22 DIAGNOSIS — Z3400 Encounter for supervision of normal first pregnancy, unspecified trimester: Secondary | ICD-10-CM

## 2017-01-22 DIAGNOSIS — O09899 Supervision of other high risk pregnancies, unspecified trimester: Secondary | ICD-10-CM

## 2017-01-22 DIAGNOSIS — O9989 Other specified diseases and conditions complicating pregnancy, childbirth and the puerperium: Principal | ICD-10-CM

## 2017-01-22 NOTE — Progress Notes (Signed)
   PRENATAL VISIT NOTE  Subjective:  Tracey Ray is a 21 y.o. G1P0 at [redacted]w[redacted]d being seen today for ongoing prenatal care.  She is currently monitored for the following issues for this low-risk pregnancy and has Supervision of normal pregnancy, antepartum and Rubella non-immune status, antepartum on her problem list.  Patient reports no complaints.  Contractions: Not present. Vag. Bleeding: None.  Movement: Present. Denies leaking of fluid.   The following portions of the patient's history were reviewed and updated as appropriate: allergies, current medications, past family history, past medical history, past social history, past surgical history and problem list. Problem list updated.  Objective:   Vitals:   01/22/17 0920  BP: 111/74  Pulse: (!) 102  Weight: 159 lb (72.1 kg)    Fetal Status: Fetal Heart Rate (bpm): 140 Fundal Height: 38 cm Movement: Present     General:  Alert, oriented and cooperative. Patient is in no acute distress.  Skin: Skin is warm and dry. No rash noted.   Cardiovascular: Normal heart rate noted  Respiratory: Normal respiratory effort, no problems with respiration noted  Abdomen: Soft, gravid, appropriate for gestational age. Pain/Pressure: Present     Pelvic:  Cervical exam deferred        Extremities: Normal range of motion.  Edema: Moderate pitting, indentation subsides rapidly  Mental Status: Normal mood and affect. Normal behavior. Normal judgment and thought content.   Assessment and Plan:  Pregnancy: G1P0 at [redacted]w[redacted]d  1. Rubella non-immune status, antepartum Will offer pp  2. Supervision of normal first pregnancy, antepartum Patient is doing well without complaints. A few occasional contractions Discussed IOL at 41 week if no SOL  Term labor symptoms and general obstetric precautions including but not limited to vaginal bleeding, contractions, leaking of fluid and fetal movement were reviewed in detail with the patient. Please refer  to After Visit Summary for other counseling recommendations.  Return in about 1 week (around 01/29/2017) for [place].   Mora Bellman, MD

## 2017-01-27 ENCOUNTER — Encounter: Payer: Medicaid Other | Admitting: Family Medicine

## 2017-01-30 ENCOUNTER — Inpatient Hospital Stay (HOSPITAL_COMMUNITY)
Admission: AD | Admit: 2017-01-30 | Discharge: 2017-02-02 | DRG: 775 | Disposition: A | Discharge status: Home / Self Care | Payer: Medicaid Other | Source: Ambulatory Visit | Attending: Obstetrics and Gynecology | Admitting: Obstetrics and Gynecology

## 2017-01-30 ENCOUNTER — Encounter (HOSPITAL_COMMUNITY): Payer: Self-pay

## 2017-01-30 ENCOUNTER — Ambulatory Visit (INDEPENDENT_AMBULATORY_CARE_PROVIDER_SITE_OTHER): Payer: Medicaid Other | Admitting: Obstetrics and Gynecology

## 2017-01-30 ENCOUNTER — Telehealth (HOSPITAL_COMMUNITY): Payer: Self-pay | Admitting: *Deleted

## 2017-01-30 VITALS — BP 108/71 | HR 78 | Wt 160.0 lb

## 2017-01-30 DIAGNOSIS — Z3A39 39 weeks gestation of pregnancy: Secondary | ICD-10-CM | POA: Diagnosis not present

## 2017-01-30 DIAGNOSIS — Z2839 Other underimmunization status: Secondary | ICD-10-CM

## 2017-01-30 DIAGNOSIS — O9989 Other specified diseases and conditions complicating pregnancy, childbirth and the puerperium: Secondary | ICD-10-CM

## 2017-01-30 DIAGNOSIS — Z283 Underimmunization status: Secondary | ICD-10-CM

## 2017-01-30 DIAGNOSIS — Z3400 Encounter for supervision of normal first pregnancy, unspecified trimester: Secondary | ICD-10-CM

## 2017-01-30 DIAGNOSIS — O4292 Full-term premature rupture of membranes, unspecified as to length of time between rupture and onset of labor: Secondary | ICD-10-CM | POA: Diagnosis present

## 2017-01-30 DIAGNOSIS — Z3483 Encounter for supervision of other normal pregnancy, third trimester: Secondary | ICD-10-CM

## 2017-01-30 DIAGNOSIS — Z3493 Encounter for supervision of normal pregnancy, unspecified, third trimester: Secondary | ICD-10-CM | POA: Diagnosis present

## 2017-01-30 LAB — CBC
HCT: 34.8 % — ABNORMAL LOW (ref 36.0–46.0)
Hemoglobin: 10.8 g/dL — ABNORMAL LOW (ref 12.0–15.0)
MCH: 24.8 pg — ABNORMAL LOW (ref 26.0–34.0)
MCHC: 31 g/dL (ref 30.0–36.0)
MCV: 80 fL (ref 78.0–100.0)
PLATELETS: 283 10*3/uL (ref 150–400)
RBC: 4.35 MIL/uL (ref 3.87–5.11)
RDW: 17.9 % — ABNORMAL HIGH (ref 11.5–15.5)
WBC: 9.7 10*3/uL (ref 4.0–10.5)

## 2017-01-30 LAB — POCT FERN TEST: POCT Fern Test: POSITIVE

## 2017-01-30 MED ORDER — FENTANYL CITRATE (PF) 100 MCG/2ML IJ SOLN
100.0000 ug | INTRAMUSCULAR | Status: DC | PRN
  Administered 2017-01-31 (×3): 100 ug via INTRAVENOUS
  Filled 2017-01-30 (×4): qty 2

## 2017-01-30 MED ORDER — LACTATED RINGERS IV SOLN
INTRAVENOUS | Status: DC
  Administered 2017-01-30 – 2017-01-31 (×3): via INTRAVENOUS

## 2017-01-30 MED ORDER — LACTATED RINGERS IV SOLN
500.0000 mL | INTRAVENOUS | Status: DC | PRN
  Administered 2017-01-31: 500 mL via INTRAVENOUS

## 2017-01-30 MED ORDER — SOD CITRATE-CITRIC ACID 500-334 MG/5ML PO SOLN: 30.0000 mL | ORAL | Status: DC | PRN

## 2017-01-30 MED ORDER — ONDANSETRON HCL 4 MG/2ML IJ SOLN
4.0000 mg | Freq: Four times a day (QID) | INTRAMUSCULAR | Status: DC | PRN
  Administered 2017-01-31: 4 mg via INTRAVENOUS
  Filled 2017-01-30: qty 2

## 2017-01-30 MED ORDER — LIDOCAINE HCL (PF) 1 % IJ SOLN
30.0000 mL | INTRAMUSCULAR | Status: DC | PRN
  Filled 2017-01-30: qty 30

## 2017-01-30 MED ORDER — OXYTOCIN BOLUS FROM INFUSION
500.0000 mL | Freq: Once | INTRAVENOUS | Status: AC
  Administered 2017-01-31: 500 mL via INTRAVENOUS

## 2017-01-30 MED ORDER — ACETAMINOPHEN 325 MG PO TABS: 650.0000 mg | ORAL_TABLET | ORAL | Status: DC | PRN

## 2017-01-30 MED ORDER — TERBUTALINE SULFATE 1 MG/ML IJ SOLN
0.2500 mg | Freq: Once | INTRAMUSCULAR | Status: DC | PRN
  Filled 2017-01-30: qty 1

## 2017-01-30 MED ORDER — OXYCODONE-ACETAMINOPHEN 5-325 MG PO TABS: 2.0000 | ORAL_TABLET | ORAL | Status: DC | PRN

## 2017-01-30 MED ORDER — FLEET ENEMA 7-19 GM/118ML RE ENEM: 1.0000 | ENEMA | Freq: Every day | RECTAL | Status: DC | PRN

## 2017-01-30 MED ORDER — MISOPROSTOL 25 MCG QUARTER TABLET: 25.0000 ug | ORAL_TABLET | ORAL | Status: DC | PRN

## 2017-01-30 MED ORDER — OXYTOCIN 40 UNITS IN LACTATED RINGERS INFUSION - SIMPLE MED
2.5000 [IU]/h | INTRAVENOUS | Status: DC
  Administered 2017-01-31: 2.5 [IU]/h via INTRAVENOUS

## 2017-01-30 MED ORDER — OXYCODONE-ACETAMINOPHEN 5-325 MG PO TABS: 1.0000 | ORAL_TABLET | ORAL | Status: DC | PRN

## 2017-01-30 NOTE — Progress Notes (Signed)
Report called to [facility] in [facility]. Pt to 169 from Triage

## 2017-01-30 NOTE — MAU Note (Signed)
Leaking fld for about 46mins. Dress soaked and fld running onto floor in triage. Clear fld. Occ ctxs

## 2017-01-30 NOTE — H&P (Signed)
LABOR ADMISSION HISTORY AND PHYSICAL  Tracey Ray is a 21 y.o. female G1P0 with IUP at [redacted]w[redacted]d by LMP presenting for SROM (clear fluid) at 2200 at home. She reports +FM, +mild contractions, no VB, no blurry vision, headaches or peripheral edema, and RUQ pain.  She plans on breast feeding. She request nexplanon for birth control.  Prenatal History/Complications:  Past Medical History: No past medical history on file.  Past Surgical History: No past surgical history on file.  Obstetrical History: OB History    Gravida Para Term Preterm AB Living   1             SAB TAB Ectopic Multiple Live Births                  Social History: Social History   Social History  . Marital status: Single    Spouse name: N/A  . Number of children: N/A  . Years of education: N/A   Social History Main Topics  . Smoking status: Never Smoker  . Smokeless tobacco: Never Used  . Alcohol use No  . Drug use: No  . Sexual activity: Yes   Other Topics Concern  . Not on file   Social History Narrative  . No narrative on file    Family History: Family History  Problem Relation Age of Onset  . Cancer Maternal Grandmother     Allergies: No Known Allergies  Prescriptions Prior to Admission  Medication Sig Dispense Refill Last Dose  . cetirizine (ZYRTEC) 10 MG tablet Take 10 mg by mouth daily.   Not Taking  . prenatal vitamin w/FE, FA (NATACHEW) 29-1 MG CHEW chewable tablet Chew 1 tablet by mouth daily at 12 noon. 30 tablet 12 Taking     Review of Systems   All systems reviewed and negative except as stated in HPI  BP 130/70 (BP Location: Right Arm)   Pulse 85   Temp 98.3 F (36.8 C)   Resp 18   Ht 5' (1.524 m)   Wt 73.9 kg (163 lb)   LMP 04/27/2016 (Exact Date)   BMI 31.83 kg/m  General appearance: alert, cooperative and appears stated age Lungs: clear to auscultation bilaterally Heart: regular rate and rhythm Abdomen: soft, non-tender; bowel sounds  normal Extremities: Homans sign is negative, no sign of DVT, edema  Presentation: cephalic Fetal monitoringBaseline: 150 bpm, moderate variability, accels present, no decels Uterine activity: q87m    Prenatal labs: ABO, Rh: O/Positive/-- (01/31 0000) Antibody: Negative (01/31 0000) Rubella: Nonimmune RPR: Nonreactive (01/31 0000)  HBsAg: Negative (01/31 0000)  HIV: Non-reactive (01/31 0000)  GBS:   negative 1 hr Glucola negative Genetic screening  Not available Anatomy US: normal  Prenatal Transfer Tool  Maternal Diabetes: No Genetic Screening: not available Maternal Ultrasounds/Referrals: Normal Fetal Ultrasounds or other Referrals:  None Maternal Substance Abuse:  No Significant Maternal Medications:  None Significant Maternal Lab Results: Lab values include: Group B Strep negative  Results for orders placed or performed during the hospital encounter of 01/30/17 (from the past 24 hour(s))  Fern Test   Collection Time: 01/30/17  9:21 PM  Result Value Ref Range   POCT Fern Test Positive = ruptured amniotic membanes     Patient Active Problem List   Diagnosis Date Noted  . Supervision of normal pregnancy, antepartum 11/17/2016  . Rubella non-immune status, antepartum 11/17/2016    Assessment: Tracey Ray is a 21 y.o. G1P0 at [redacted]w[redacted]d here for SROM and SOL.  #Labor: SROM,  ctx q31m, can consider citotec #Pain: Declines epidural, plans on IV meds #FWB: Category I #ID:  GBS negative #MOF: breast #MOC:nexplanon #Circ:  yes  Everrett Coombe, MD PGY-1 [facility]  OB FELLOW HISTORY AND PHYSICAL ATTESTATION  I have seen and examined this patient; I agree with above documentation in the resident's note.    Katherine Basset, DO OB Fellow

## 2017-01-30 NOTE — Progress Notes (Signed)
Prenatal Visit Note Date: 01/30/2017 Clinic: [facility]  Subjective:  Tracey Ray is a 21 y.o. G1P0 at [redacted]w[redacted]d being seen today for ongoing prenatal care.  She is currently monitored for the following issues for this low-risk pregnancy and has Supervision of normal pregnancy, antepartum and Rubella non-immune status, antepartum on her problem list.  Patient reports no complaints.   Contractions: Not present. Vag. Bleeding: None.  Movement: Present. Denies leaking of fluid.   The following portions of the patient's history were reviewed and updated as appropriate: allergies, current medications, past family history, past medical history, past social history, past surgical history and problem list. Problem list updated.  Objective:   Vitals:   01/30/17 1126  BP: 108/71  Pulse: 78  Weight: 160 lb (72.6 kg)    Fetal Status: Fetal Heart Rate (bpm): 148 Fundal Height: 38 cm Movement: Present  Presentation: Vertex  General:  Alert, oriented and cooperative. Patient is in no acute distress.  Skin: Skin is warm and dry. No rash noted.   Cardiovascular: Normal heart rate noted  Respiratory: Normal respiratory effort, no problems with respiration noted  Abdomen: Soft, gravid, appropriate for gestational age. Pain/Pressure: Present     Pelvic:  Cervical exam deferred        Extremities: Normal range of motion.  Edema: Mild pitting, slight indentation  Mental Status: Normal mood and affect. Normal behavior. Normal judgment and thought content.   Urinalysis:      Assessment and Plan:  Pregnancy: G1P0 at [redacted]w[redacted]d  D/w pt re: PDIOL. Form signed today and will set up for about a week past EDC.  Term labor symptoms and general obstetric precautions including but not limited to vaginal bleeding, contractions, leaking of fluid and fetal movement were reviewed in detail with the patient. Please refer to After Visit Summary for other counseling recommendations.   Return in about 1 week (around 02/06/2017).   Aletha Halim, MD

## 2017-01-31 ENCOUNTER — Inpatient Hospital Stay (HOSPITAL_COMMUNITY): Payer: Medicaid Other | Admitting: Anesthesiology

## 2017-01-31 ENCOUNTER — Encounter (HOSPITAL_COMMUNITY): Payer: Self-pay

## 2017-01-31 DIAGNOSIS — Z3A39 39 weeks gestation of pregnancy: Secondary | ICD-10-CM

## 2017-01-31 LAB — RPR: RPR Ser Ql: NONREACTIVE

## 2017-01-31 LAB — TYPE AND SCREEN
ABO/RH(D): O POS
Antibody Screen: NEGATIVE

## 2017-01-31 LAB — ABO/RH: ABO/RH(D): O POS

## 2017-01-31 MED ORDER — EPHEDRINE 5 MG/ML INJ
10.0000 mg | INTRAVENOUS | Status: DC | PRN
  Filled 2017-01-31: qty 2

## 2017-01-31 MED ORDER — PHENYLEPHRINE 40 MCG/ML (10ML) SYRINGE FOR IV PUSH (FOR BLOOD PRESSURE SUPPORT)
80.0000 ug | PREFILLED_SYRINGE | INTRAVENOUS | Status: DC | PRN
  Filled 2017-01-31: qty 10
  Filled 2017-01-31: qty 5

## 2017-01-31 MED ORDER — SENNOSIDES-DOCUSATE SODIUM 8.6-50 MG PO TABS
2.0000 | ORAL_TABLET | ORAL | Status: DC
  Administered 2017-01-31 – 2017-02-01 (×2): 2 via ORAL
  Filled 2017-01-31 (×2): qty 2

## 2017-01-31 MED ORDER — ACETAMINOPHEN 325 MG PO TABS: 650.0000 mg | ORAL_TABLET | ORAL | Status: DC | PRN

## 2017-01-31 MED ORDER — DIBUCAINE 1 % RE OINT: 1.0000 "application " | TOPICAL_OINTMENT | RECTAL | Status: DC | PRN

## 2017-01-31 MED ORDER — MISOPROSTOL 200 MCG PO TABS
50.0000 ug | ORAL_TABLET | ORAL | Status: DC | PRN
  Administered 2017-01-31 (×2): 50 ug via ORAL
  Filled 2017-01-31 (×2): qty 1

## 2017-01-31 MED ORDER — TERBUTALINE SULFATE 1 MG/ML IJ SOLN
0.2500 mg | Freq: Once | INTRAMUSCULAR | Status: DC | PRN
  Filled 2017-01-31: qty 1

## 2017-01-31 MED ORDER — LACTATED RINGERS IV SOLN
INTRAVENOUS | Status: DC
  Administered 2017-01-31: 14:00:00 via INTRAUTERINE

## 2017-01-31 MED ORDER — OXYCODONE HCL 5 MG PO TABS: 10.0000 mg | ORAL_TABLET | ORAL | Status: DC | PRN

## 2017-01-31 MED ORDER — COCONUT OIL OIL: 1.0000 "application " | TOPICAL_OIL | Status: DC | PRN

## 2017-01-31 MED ORDER — LIDOCAINE HCL (PF) 1 % IJ SOLN
INTRAMUSCULAR | Status: DC | PRN
  Administered 2017-01-31 (×2): 5 mL via EPIDURAL

## 2017-01-31 MED ORDER — SIMETHICONE 80 MG PO CHEW: 80.0000 mg | CHEWABLE_TABLET | ORAL | Status: DC | PRN

## 2017-01-31 MED ORDER — ONDANSETRON HCL 4 MG PO TABS: 4.0000 mg | ORAL_TABLET | ORAL | Status: DC | PRN

## 2017-01-31 MED ORDER — PRENATAL MULTIVITAMIN CH
1.0000 | ORAL_TABLET | Freq: Every day | ORAL | Status: DC
  Administered 2017-02-01 – 2017-02-02 (×2): 1 via ORAL
  Filled 2017-01-31 (×2): qty 1

## 2017-01-31 MED ORDER — OXYCODONE HCL 5 MG PO TABS: 5.0000 mg | ORAL_TABLET | ORAL | Status: DC | PRN

## 2017-01-31 MED ORDER — METHYLERGONOVINE MALEATE 0.2 MG/ML IJ SOLN: 0.2000 mg | INTRAMUSCULAR | Status: DC | PRN

## 2017-01-31 MED ORDER — LACTATED RINGERS IV SOLN
500.0000 mL | Freq: Once | INTRAVENOUS | Status: AC
  Administered 2017-01-31: 500 mL via INTRAVENOUS

## 2017-01-31 MED ORDER — DIPHENHYDRAMINE HCL 50 MG/ML IJ SOLN: 12.5000 mg | INTRAMUSCULAR | Status: DC | PRN

## 2017-01-31 MED ORDER — WITCH HAZEL-GLYCERIN EX PADS: 1.0000 "application " | MEDICATED_PAD | CUTANEOUS | Status: DC | PRN

## 2017-01-31 MED ORDER — IBUPROFEN 600 MG PO TABS
600.0000 mg | ORAL_TABLET | Freq: Four times a day (QID) | ORAL | Status: DC
  Administered 2017-01-31 – 2017-02-02 (×7): 600 mg via ORAL
  Filled 2017-01-31 (×7): qty 1

## 2017-01-31 MED ORDER — ZOLPIDEM TARTRATE 5 MG PO TABS: 5.0000 mg | ORAL_TABLET | Freq: Every evening | ORAL | Status: DC | PRN

## 2017-01-31 MED ORDER — PHENYLEPHRINE 40 MCG/ML (10ML) SYRINGE FOR IV PUSH (FOR BLOOD PRESSURE SUPPORT)
80.0000 ug | PREFILLED_SYRINGE | INTRAVENOUS | Status: DC | PRN
  Filled 2017-01-31: qty 5

## 2017-01-31 MED ORDER — MEASLES, MUMPS & RUBELLA VAC ~~LOC~~ INJ
0.5000 mL | INJECTION | Freq: Once | SUBCUTANEOUS | Status: AC
Start: 2017-02-01 — End: 2017-02-02
  Administered 2017-02-02: 0.5 mL via SUBCUTANEOUS
  Filled 2017-01-31 (×2): qty 0.5

## 2017-01-31 MED ORDER — OXYTOCIN 40 UNITS IN LACTATED RINGERS INFUSION - SIMPLE MED
1.0000 m[IU]/min | INTRAVENOUS | Status: DC
  Administered 2017-01-31: 2 m[IU]/min via INTRAVENOUS
  Filled 2017-01-31: qty 1000

## 2017-01-31 MED ORDER — BENZOCAINE-MENTHOL 20-0.5 % EX AERO
1.0000 "application " | INHALATION_SPRAY | CUTANEOUS | Status: DC | PRN
  Administered 2017-01-31: 1 via TOPICAL
  Filled 2017-01-31: qty 56

## 2017-01-31 MED ORDER — DIPHENHYDRAMINE HCL 25 MG PO CAPS: 25.0000 mg | ORAL_CAPSULE | Freq: Four times a day (QID) | ORAL | Status: DC | PRN

## 2017-01-31 MED ORDER — TETANUS-DIPHTH-ACELL PERTUSSIS 5-2.5-18.5 LF-MCG/0.5 IM SUSP: 0.5000 mL | Freq: Once | INTRAMUSCULAR | Status: DC

## 2017-01-31 MED ORDER — ONDANSETRON HCL 4 MG/2ML IJ SOLN: 4.0000 mg | INTRAMUSCULAR | Status: DC | PRN

## 2017-01-31 MED ORDER — METHYLERGONOVINE MALEATE 0.2 MG PO TABS: 0.2000 mg | ORAL_TABLET | ORAL | Status: DC | PRN

## 2017-01-31 MED ORDER — FENTANYL 2.5 MCG/ML BUPIVACAINE 1/10 % EPIDURAL INFUSION (WH - ANES)
14.0000 mL/h | INTRAMUSCULAR | Status: DC | PRN
  Administered 2017-01-31 (×2): 14 mL/h via EPIDURAL
  Filled 2017-01-31: qty 100

## 2017-01-31 NOTE — Progress Notes (Signed)
Labor Progress Note Tracey Ray is a 21 y.o. G1P0 at [redacted]w[redacted]d presented for PROM at term  S:  Feeling ctx, doesn't want pain meds.  O:  BP 125/68   Pulse 77   Temp 98.9 F (37.2 C) (Oral)   Resp 18   Ht 5' (1.524 m)   Wt 73.9 kg (163 lb)   LMP 04/27/2016 (Exact Date)   BMI 31.83 kg/m  EFM: baseline 145 bpm/ mod variability/ + accels/ variable decels  Toco: 2-4 SVE: 6/60/-2  A/P: 20 y.o. G1P0 [redacted]w[redacted]d  1. Labor: latent 2. FWB: Cat II 3. Pain: analgesia/anesthesia prn, pt choice IUPC placed. If MVU not adequate will start Pitocin. Anticipate labor progression and SVD.  Julianne Handler, CNM 11:39 AM

## 2017-01-31 NOTE — Progress Notes (Signed)
Labor Progress Note  Tracey Ray is a 21 y.o. G1P0 at [redacted]w[redacted]d  admitted for rupture of membranes  FHT:  145 bmp, mod variability, accels, no decels UC:   regular, every 4 minutes SVE:   Dilation: 1 Effacement (%): Thick Station: -3 Exam by:: Dr. Vanetta Shawl SROM: green   Labs: Lab Results  Component Value Date   WBC 9.7 01/30/2017   HGB 10.8 (L) 01/30/2017   HCT 34.8 (L) 01/30/2017   MCV 80.0 01/30/2017   PLT 283 01/30/2017    Assessment / Plan: 21 y.o. G1P0 [redacted]w[redacted]d in early/active labor Spontaneous labor, progressing normally  Labor: Progressing normally, continue cytotec Fetal Wellbeing:  Category I Pain Control:  Declines epidural, plans IV pain control Anticipated MOD:  NSVD  Expectant management  Everrett Coombe, MD PGY-1 [facility]

## 2017-01-31 NOTE — Anesthesia Preprocedure Evaluation (Signed)
Anesthesia Evaluation  Patient identified by MRN, date of birth, ID band Patient awake    Reviewed: Allergy & Precautions, H&P , NPO status , Patient's Chart, lab work & pertinent test results, reviewed documented beta blocker date and time   Airway Mallampati: I  TM Distance: >3 FB Neck ROM: full    Dental no notable dental hx.    Pulmonary neg pulmonary ROS,    Pulmonary exam normal breath sounds clear to auscultation       Cardiovascular negative cardio ROS Normal cardiovascular exam Rhythm:regular Rate:Normal     Neuro/Psych negative neurological ROS  negative psych ROS   GI/Hepatic negative GI ROS, Neg liver ROS,   Endo/Other  negative endocrine ROS  Renal/GU negative Renal ROS  negative genitourinary   Musculoskeletal   Abdominal   Peds  Hematology negative hematology ROS (+)   Anesthesia Other Findings   Reproductive/Obstetrics (+) Pregnancy                             Anesthesia Physical Anesthesia Plan  ASA: II  Anesthesia Plan: Epidural   Post-op Pain Management:    Induction:   Airway Management Planned:   Additional Equipment:   Intra-op Plan:   Post-operative Plan:   Informed Consent: I have reviewed the patients History and Physical, chart, labs and discussed the procedure including the risks, benefits and alternatives for the proposed anesthesia with the patient or authorized representative who has indicated his/her understanding and acceptance.     Plan Discussed with:   Anesthesia Plan Comments:         Anesthesia Quick Evaluation

## 2017-01-31 NOTE — Anesthesia Procedure Notes (Signed)
Epidural Patient location during procedure: OB Start time: 01/31/2017 4:25 PM End time: 01/31/2017 4:35 PM  Staffing Anesthesiologist: Osie Amparo  Preanesthetic Checklist Completed: patient identified, site marked, surgical consent, pre-op evaluation, timeout performed, IV checked, risks and benefits discussed and monitors and equipment checked  Epidural Patient position: sitting Prep: site prepped and draped and DuraPrep Patient monitoring: continuous pulse ox and blood pressure Approach: midline Location: L4-L5 Injection technique: LOR air  Needle:  Needle type: Tuohy  Needle gauge: 17 G Needle length: 9 cm and 9 Needle insertion depth: 4 cm Catheter type: closed end flexible Catheter size: 19 Gauge Catheter at skin depth: 10 cm Test dose: negative  Assessment Sensory level: T10 Events: blood not aspirated, injection not painful, no injection resistance, negative IV test and no paresthesia

## 2017-01-31 NOTE — Anesthesia Pain Management Evaluation Note (Signed)
  CRNA Pain Management Visit Note  Patient: Tracey Ray, 21 y.o., female  "Hello I am a member of the anesthesia team at [facility]. We have an anesthesia team available at all times to provide care throughout the hospital, including epidural management and anesthesia for C-section. I don't know your plan for the delivery whether it a natural birth, water birth, IV sedation, nitrous supplementation, doula or epidural, but we want to meet your pain goals."   1.Was your pain managed to your expectations on prior hospitalizations?   No prior hospitalizations  2.What is your expectation for pain management during this hospitalization?     Labor support without medications  3.How can we help you reach that goal? unsure  Record the patient's initial score and the patient's pain goal.   Pain: 6  Pain Goal: 7 [facility] wants you to be able to say your pain was always managed very well.  Casimer Lanius 01/31/2017

## 2017-01-31 NOTE — Progress Notes (Signed)
Patient ID: Tracey Ray, female   DOB: 05/16/1996, 21 y.o.   MRN: [id number]  S: Patient seen & examined for progress of labor. Patient comfortable.    O:  Vitals:   01/30/17 2115 01/30/17 2146 01/30/17 2150 01/30/17 2330  BP: 130/70  125/87   Pulse: 85  96   Resp: 18 18    Temp: 98.3 F (36.8 C) 99.2 F (37.3 C)  99.4 F (37.4 C)  TempSrc:  Oral  Oral  Weight: 163 lb (73.9 kg)     Height: 5' (1.524 m)       Dilation: 1 Effacement (%): Thick Station: -3 Presentation: Vertex Exam by:: Dr. Vanetta Shawl   FHT: 150bpm, mod var, +accels, no decels TOCO: Irregular  FB placed with ease, light blood tinged fluid noted.  A/P: FB placed PO cytotec Continue expectant management Anticipate SVD

## 2017-01-31 NOTE — Progress Notes (Signed)
Labor Progress Note Tracey Ray is a 21 y.o. G1P0 at [redacted]w[redacted]d presented for PROM at term.  S:  Feeling ctx, recent dose of Fentanyl.  O:  BP 120/82   Pulse 100   Temp 98.9 F (37.2 C) (Axillary)   Resp 18   Ht 5' (1.524 m)   Wt 73.9 kg (163 lb)   LMP 04/27/2016 (Exact Date)   BMI 31.83 kg/m  EFM: baseline 140 bpm/ mod variability/ no accels/ variable and late decels  Toco: 1-4 SVE: 6/60/-2 by Drake Leach, CNM Pitocin: 2 mu/min  A/P: 21 y.o. G1P0 [redacted]w[redacted]d  1. Labor: latent 2. FWB: Cat II 3. Pain: analgesia prn, epidural if desires Will start amnioinfusion for variable decels. Monitor FHT closely. Anticipate labor progression and SVD.  Julianne Handler, CNM 7:02 PM

## 2017-01-31 NOTE — Progress Notes (Signed)
CNM advised RN after reviewing the strip that it was ok to start Pit.

## 2017-02-01 NOTE — Progress Notes (Signed)
Post Partum Day 1 Subjective: no complaints, up ad lib, voiding and tolerating PO  Objective: Blood pressure (!) 97/59, pulse 65, temperature 97.7 F (36.5 C), temperature source Oral, resp. rate 18, height 5' (1.524 m), weight 163 lb (73.9 kg), last menstrual period 04/27/2016, unknown if currently breastfeeding.  Physical Exam:  General: alert, cooperative and no distress Lochia: appropriate Uterine Fundus: firm Incision: no significant drainage DVT Evaluation: No evidence of DVT seen on physical exam.   Recent Labs  01/30/17 2152  HGB 10.8*  HCT 34.8*    Assessment/Plan: Plan for discharge tomorrow and Breastfeeding   LOS: 2 days   Tracey Ray 02/01/2017, 9:18 AM

## 2017-02-01 NOTE — Lactation Note (Signed)
This note was copied from a baby's chart. Lactation Consultation Note  Patient Name: Tracey Ray [id number] Date: 02/01/2017 Reason for consult: Initial assessment Assisted Mom with positioning and latching baby. Mom has lots of colostrum with hand expression. Baby has been sleepy today and not wanting to latch. At this visit, baby would take few suckles then become sleepy. Lots of stimulation needed and re-latching to keep baby engaged at breast but he did demonstrate some good suckling bursts. Advised Mom to keep baby STS. Encouraged to BF with feeding ques but if baby continues to be sleepy to wake baby to BF. Advised to call for assist with latch. Mom starts to giggle when baby latches and this seems to interfere with baby sustaining the latch. Discussed cluster feeding with Mom and would expect baby may cluster feed this evening. Advised baby should start waking to BF and be at breast 8-12 times in 24 hours and with feeding ques, sustaining latch for 15-30 minutes. :Lots of basic teaching reviewed. If baby continues to not wake to BF or sustain the latch, advised Mom to hand express and call for assist with spoon feeding. May set up DEBP later if feedings do not improve. RN advised of plan.   Maternal Data Has patient been taught Hand Expression?: Yes Does the patient have breastfeeding experience prior to this delivery?: No  Feeding Feeding Type: Breast Fed Length of feed: 15 min (off/on)  LATCH Score/Interventions Latch: Repeated attempts needed to sustain latch, nipple held in mouth throughout feeding, stimulation needed to elicit sucking reflex. Intervention(s): Adjust position;Assist with latch;Breast massage;Breast compression  Audible Swallowing: A few with stimulation  Type of Nipple: Everted at rest and after stimulation  Comfort (Breast/Nipple): Soft / non-tender     Hold (Positioning): Assistance needed to correctly position infant at breast and maintain  latch. Intervention(s): Breastfeeding basics reviewed;Support Pillows;Position options;Skin to skin  LATCH Score: 7  Lactation Tools Discussed/Used WIC Program: No   Consult Status Consult Status: Follow-up Date: 02/02/17 Follow-up type: In-patient    Katrine Coho 02/01/2017, 4:27 PM

## 2017-02-01 NOTE — Progress Notes (Signed)
MOB was referred for [facility] @ [facility]. Upon chart review, CSW saw noted in patients [facility] records "Tracey Ray is a 21 y.o. G1P0 at [redacted]w[redacted]d being seen today for transferring prenatal care from [name]." meaning MOB had [facility] prior to transferring.   Referral is screened out by CSW due to [facility] obtaining [place] prior to reaching 28 wks 1 day.   Please contact the Clinical Social Worker if needs arise or upon MOB request.    Tracey Ray, MSW, [facility]  Office: [phone]

## 2017-02-01 NOTE — Anesthesia Postprocedure Evaluation (Signed)
Anesthesia Post Note  Patient: Tracey Ray  Procedure(s) Performed: * No procedures listed *     Patient location during evaluation: Mother Baby Anesthesia Type: Epidural Level of consciousness: awake Pain management: satisfactory to patient Vital Signs Assessment: post-procedure vital signs reviewed and stable Respiratory status: spontaneous breathing Cardiovascular status: stable Anesthetic complications: no    Last Vitals:  Vitals:   02/01/17 0304 02/01/17 0525  BP: (!) 95/47 (!) 97/59  Pulse: 66 65  Resp: 18 18  Temp: 36.6 C 36.5 C    Last Pain:  Vitals:   02/01/17 1149  TempSrc:   PainSc: 0-No pain   Pain Goal: Patients Stated Pain Goal: 0 (01/30/17 2118)               Casimer Lanius

## 2017-02-02 ENCOUNTER — Ambulatory Visit: Payer: Self-pay

## 2017-02-02 MED ORDER — IBUPROFEN 600 MG PO TABS: 600.0000 mg | ORAL_TABLET | Freq: Four times a day (QID) | ORAL | 0 refills | Status: DC

## 2017-02-02 NOTE — Discharge Instructions (Signed)
Instrucciones para la mamá sobre los cuidados en el hogar °(Home Care Instructions for Mom) °ACTIVIDAD °· Reanude sus actividades regulares de forma gradual. °· Descanse. Tome siestas cuando el bebé duerme. °· No levante objetos que pesen más de 10 libras (4,5 kg) hasta que el médico se lo autorice. °· Evite las actividades que demandan mucho esfuerzo y energía (que son extenuantes) hasta que el médico se lo autorice. Caminar a un ritmo tranquilo a moderado siempre es más seguro. °· Si tuvo un parto por cesárea: °? No pase la aspiradora, suba escaleras o conduzca un vehículo durante 4 o 6 semanas. °? Pídale a alguien que le brinde ayuda con las tareas domésticas hasta que pueda realizarlas por su cuenta. °? Haga ejercicios como se lo haya indicado el médico, si corresponde. ° °HEMORRAGIA VAGINAL °Probablemente continúe sangrando durante 4 o 6 semanas después del parto. Generalmente, la cantidad de sangre disminuye y el color se hace más claro con el transcurso del tiempo. Sin embargo, si usted está demasiado activa, el color de la sangre puede ser rojo brillante. Si necesita cambiarse la compresa higiénica en menos de una hora o tiene coágulos grandes: °· Permanezca acostada. °· Eleve los pies. °· Coloque compresas frías en la zona inferior del abdomen. °· Haga reposo. °· Comuníquese con su médico. °Si está amamantando, podría volver a tener su período entre las 8 semanas después del parto y el momento en que deje de amamantar. Si no está amamantando, volverá a tener su período 6 u 8 semanas después del parto. °CUIDADOS PERINEALES °La zona perineal o perineo, es la parte del cuerpo que se encuentra entre los muslos. Después del parto, esta zona necesita un cuidado especial. Siga las siguientes indicaciones como se lo haya indicado su médico. °· Tome baños de inmersión durante 15 o 20 minutos. °· Utilice apósitos o aerosoles analgésicos y cremas como se lo hayan indicado. °· No utilice tampones ni se haga duchas  vaginales hasta que el sangrado vaginal se haya detenido. °· Cada vez que vaya al baño: °? Use una botella perineal. °? Cámbiese el apósito. °? Use papel tisú en lugar de papel higiénico hasta que se cure la sutura. °· Haga ejercicios de Kegel todos los días. Los ejercicios Kegel ayudan a mantener los músculos que sostienen la vagina, la vejiga y los intestinos. Estos ejercicios se pueden realizar mientras está parada, sentada o acostada. Para hacer los ejercicios de Kegel: °? Tense los músculos del estómago y los que rodean el canal de parto. °? Mantenga esta posición durante unos segundos. °? Relájese. °? Repita hasta hacerlos 5 veces seguidas. °· Para evitar las hemorroides o que estas empeoren: °? Beba suficiente líquido para mantener la orina clara o de color amarillo pálido. °? Evite hacer fuerza al defecar. °? Tome los medicamentos y laxantes de venta libre como se lo haya indicado el médico. °CUIDADO DE LAS MAMAS °· Use un buen sostén. °· Evite tomar analgésicos de venta libre para las molestias de los pechos. °· Aplique hielo en los pechos para aliviar las molestias tanto como sea necesario: °? Ponga el hielo en una bolsa plástica. °? Coloque una toalla entre la piel y la bolsa de hielo. °? Aplique el hielo durante 20, o como se lo haya indicado el médico. ° °NUTRICIÓN °· Mantenga una dieta bien balanceada. °· No intente perder de peso rápidamente reduciendo el consumo de calorías. °· Tome sus vitaminas prenatales hasta el control de postparto o hasta que su médico se lo indique. ° °DEPRESIÓN POSTPARTO °  Puede sentir deseos de llorar sin motivo aparente y verse incapaz de enfrentarse a todos los cambios que implica tener un bebé. Este estado de ánimo se llama depresión postparto. La depresión postparto ocurre porque sus niveles hormonales sufren cambios después del parto. Si usted tiene depresión postparto, busque contención por parte de su pareja, sus amigos y su familia. Si la depresión no desaparece por  sí sola después de algunas semanas, concurra a su médico. °AUTOEXAMEN DE MAMAS °Realícese autoexámenes en el mismo momento cada mes. Si está amamantando, el mejor momento de controlar sus mamas es después de alimentar al bebé, cuando los pechos no están tan llenos. Si está amamantando y su período ya comenzó, controle sus mamas el día 5, 6 o 7 de su período. °Informe a su médico de cualquier protuberancia, bulto o secreción. Si está amamantando, las mamas normalmente tienen bultos. Esto es transitorio y no es un riesgo para la salud. °INTIMIDAD Y SEXUALIDAD °Debe evitar las relaciones sexuales durante al menos 3 o 4 semanas después del parto o hasta que el flujo de color rojo amarronado haya desaparecido completamente. Si no desea quedar embarazada nuevamente, use algún método anticonceptivo. Después del parto, puede quedar embarazada incluso si no ha tenido todavía el período. °SOLICITE ATENCIÓN MÉDICA SI: °· Se siente incapaz de controlar los cambios que implica tener un hijo y esos sentimientos no desaparecen después de algunas semanas. °· Detecta una protuberancia, bulto o secreción en sus mamas. ° °SOLICITE ATENCIÓN MÉDICA DE INMEDIATO SI: °· Debe cambiarse la compresa higiénica en 1 hora o menos. °· Tiene los siguientes síntomas: °? Dolor intenso o calambres en la parte inferior del abdomen. °? Una secreción vaginal con mal olor. °? Fiebre que no se alivia con los medicamentos. °? Una zona de la mama se pone roja y le causa dolor, y además usted tiene fiebre. °? Una pantorrilla enrojecida y con dolor. °? Repentino e intenso dolor en el pecho. °? Falta de aire. °? Micción dolorosa o con sangre. °? Problemas visuales. °· Vómitos durante 12 horas o más. °· Dolor de cabeza intenso. °· Tiene pensamientos serios acerca de lastimarse a usted misma o dañar al niño o a otra persona. ° °Esta información no tiene como fin reemplazar el consejo del médico. Asegúrese de hacerle al médico cualquier pregunta que  tenga. °Document Released: 08/18/2005 Document Revised: 12/10/2015 Document Reviewed: 02/19/2015 °Elsevier Interactive Patient Education © 2017 [employer] ° °

## 2017-02-02 NOTE — Discharge Summary (Signed)
Obstetric Discharge Summary Reason for Admission: rupture of membranes Prenatal Procedures: none Intrapartum Procedures: spontaneous vaginal delivery Postpartum Procedures: none Complications-Operative and Postpartum: 1st degree perineal laceration Hemoglobin  Date Value Ref Range Status  01/30/2017 10.8 (L) 12.0 - 15.0 g/dL Final  10/01/2016 12.5 g/dL Final   HCT  Date Value Ref Range Status  01/30/2017 34.8 (L) 36.0 - 46.0 % Final  10/01/2016 37 % Final    Physical Exam:  General: alert, cooperative and no distress Lochia: appropriate Uterine Fundus: firm  Discharge Diagnoses: Term Pregnancy-delivered  Discharge Information: Date: 02/02/2017 Activity: pelvic rest Diet: routine Medications: Ibuprofen Condition: stable Instructions: refer to practice specific booklet Discharge to: home [place] for [place] at [facility]. Schedule an appointment as soon as possible for a visit in 4 week(s).   Specialty:  Obstetrics and Gynecology Why:  for postpartum visit and Nexplanon placement Contact information: [place] [phone]          Newborn Data: Live born female  Birth Weight: 6 lb 13.5 oz (3104 g) APGAR: 8, 9  Home with mother.  Len Blalock SNM 02/02/2017, 7:43 AM   OB FELLOW DISCHARGE ATTESTATION  I confirm that I have verified the information documented in the student midwives's note and that I have also personally reperformed the physical exam and all medical decision making activities.     Jacquiline Doe, MD 11:27 AM

## 2017-02-02 NOTE — Progress Notes (Signed)
Post discharge chart review completed.  

## 2017-02-02 NOTE — Lactation Note (Signed)
This note was copied from a baby's chart. Lactation Consultation Note  Patient Name: Tracey Ray RFVOH'[id number] Date: 02/02/2017 Reason for consult: Follow-up assessment;Difficult latch   Follow up with mom of 74 hour old infant. Infant asleep in mom's arms. Mom reports she fed him recently.   Infant with 1 BF for 15 minutes, 6 attempts, formula via bottle x 4 (3-10 cc), EBM x 3 of 2-5 cc, 1 void and 4 stools in last 24 hours. Infant weight 6 lb 7.2 oz with 6% weight loss.   Mom reports she is trying to latch infant to breast with each feeding, he is sleepy at the breast. She is offering him to bottle after BF. She reports she has pumped a few times and did not get anything. Discussed colostrum, milk coming to volume and supply and demand. Discussed importance of pumping every 2-3 hours to stimulate milk production. Mom voiced understanding. Mom reports she is still trying hand expression and not getting much colostrum. She denies feeling fuller today. She denies nipple pain/tenderness.   Offered to assist mom with latching, she reports she feels like she knows what to do but infant wont stay awake, Enc mom to call out for feeding assistance prn. Mom voiced understanding. Mom denies questions/concerns at this time.    Maternal Data Formula Feeding for Exclusion: Yes Reason for exclusion: Mother's choice to formula and breast feed on admission Has patient been taught Hand Expression?: Yes Does the patient have breastfeeding experience prior to this delivery?: No  Feeding Feeding Type: Formula Nipple Type: Slow - flow  LATCH Score/Interventions                      Lactation Tools Discussed/Used Pump Review: Setup, frequency, and cleaning Initiated by:: reviewed and encouraged   Consult Status Consult Status: Follow-up Date: 02/03/17 Follow-up type: In-patient    [name] [name] 02/02/2017, 12:14 PM

## 2017-02-03 ENCOUNTER — Ambulatory Visit: Payer: Self-pay

## 2017-02-03 NOTE — Lactation Note (Signed)
This note was copied from a baby's chart. Lactation Consultation Note  Patient Name: Tracey Ray Today's Date: 02/03/2017 Reason for consult: Follow-up assessment  Mom last pumped around 0700 & got 5 ml. She was found to be using the wrong size flanges, so I suggested that she try the size 24 flanges, instead. Mom's breasts seem to be filling (mom reports that her breasts feel more "tense.") Transitional milk was noted when hand expression was done on her R breast. I hope that using the correct size flange will help her to yield more EBM.  Mom reports some bottle feedings take up to an hour b/c "[name]" falls asleep quickly. I attempted to bottle feed [name] after a breastfeeding attempt. He does suck and swallow well for a few minutes and then falls asleep. He tends to repeat this pattern throughout the feeding. Mom understands to keep trying and not to let him go more than 3 hours between feeding. [name] gained an ounce overnight. Thick labial frenum noted. Oral exam otherwise limited b/c he was so sleepy. When he was actively bottle-feeding, his tongue could be visualized cupping the base of the bottle teat.   WIC loaner provided. Mom knows to pump q3h for 15 min. Mom was shown how to assemble & use hand pump that was included in pump kit.  Mom knows to use the size 24 flanges. Coconut oil provided so she can lubricate the inside of the flanges.  Breast milk storage guidelines written down. Instructions provided on how to return WIC loaner.    Richey, [name] 02/03/2017, 9:35 AM    

## 2017-02-05 ENCOUNTER — Ambulatory Visit: Payer: Self-pay

## 2017-02-05 ENCOUNTER — Encounter: Payer: Medicaid Other | Admitting: Obstetrics and Gynecology

## 2017-02-05 NOTE — Lactation Note (Deleted)
This note was copied from a baby's chart. Lactation Consult  Mother's reason for visit: Visit Type: Appointment Notes:  Consult:  Initial Lactation Consultant:  Darla Lesches  ________________________________________________________________________    ________________________________________________________________________  Mother's Name: Christella Scheuermann Type of delivery:  Vaginal, Spontaneous Delivery Breastfeeding Experience:   Maternal Medical Conditions:  none Maternal Medications:  ________________________________________________________________________  Breastfeeding History (Post Discharge)  Frequency of breastfeeding:  Duration of feeding:   Patient does not supplement or pump.  Infant Intake and Output Assessment  Voids:   in 24 hrs.  Color:   Stools:   in 24 hrs.  Color:    ________________________________________________________________________  Maternal Breast Assessment  Breast:   Nipple:   Pain level:   Pain interventions:    _______________________________________________________________________ Feeding Assessment/Evaluation  Initial feeding assessment:  Infant's oral assessment:    Positioning:  LATCH documentation:  Latch:  Audible swallow  Comfort (Breast/Nipple):   Hold (Positioning):    LATCH score:   Attached assessment:    Lips flanged:    Lips untucked:    Suck assessment:    Tools:   Instructed on use and cleaning of tool:    Pre-feed weight:   Post-feed weight:   Amount transferred:   Amount supplemented:      Total amount pumped post feed:    Total amount transferred:  Total supplement given:

## 2017-02-06 ENCOUNTER — Ambulatory Visit: Payer: Self-pay

## 2017-02-06 NOTE — Lactation Note (Signed)
This note was copied from a baby's chart. Lactation Consult  Mother's reason for visit: infant was sleeping a lot and not feeding correctly and feeding very slow. Infants birth weight was 6-13.5, infant is 67 days old today. Infant was weighed at [facility] office on 6-6, and weighed  6-7.  Visit Type:  Feeding assessment Appointment Notes:  Mother reports that infant has gained weight. She has been giving formula and breastmilk to supplement . Mother has the hope of exclusively breastfeeding.  Consult:  Initial Lactation Consultant:  Darla Lesches  ________________________________________________________________________    ________________________________________________________________________  Mother's Name: Christella Scheuermann Type of delivery:  Vaginal, Spontaneous Delivery Breastfeeding Experience:  none Maternal Medical Conditions:  none Maternal Medications:  Prenatal vits  ________________________________________________________________________  Breastfeeding History (Post Discharge)  Frequency of breastfeeding: every 2.5- 3 hours Duration of feeding: 10-15   Supplementation  Formula:  Volume 3 ounces after each feeding        Brand: Similac  Breastmilk:  Volume 30 ml  Method:  Bottle, wide base bottle nipple, tommy tippee  Pumping  Type of pump:  Symphony Frequency:  3-4 times Volume:  30 ml  Infant Intake and Output Assessment  Voids: 5  in 24 hrs.  Color:  Clear yellow Stools: 5  in 24 hrs.  Color:  Yellow  ________________________________________________________________________  Maternal Breast Assessment  Breast:  Full Nipple:  Erect Pain level:  0 Pain interventions:  Bra  _______________________________________________________________________ Feeding Assessment/Evaluation  Initial feeding assessment:infant latches well , observed frequent suckles and audible swallows. Mothers breast are very full. Mother taught breast  compression.  Infant's oral assessment:  WNL  Positioning:  Cross cradle Left breast  LATCH documentation:  Latch:  2 = Grasps breast easily, tongue down, lips flanged, rhythmical sucking.  Audible swallowing:  2 = Spontaneous and intermittent  Type of nipple:  2 = Everted at rest and after stimulation  Comfort (Breast/Nipple):  1 = Filling, red/small blisters or bruises, mild/mod discomfort  Hold (Positioning):  2 = No assistance needed to correctly position infant at breast  LATCH score:  9  Attached assessment:  Deep  Lips flanged:  Yes.    Lips untucked:  Yes.    Suck assessment:  Displays both    Pre-feed weight: 3096,6-13.2 Post-feed weight: 1884,1-66.0 Amount transferred:  30 ml   Additional Feeding Assessment -   Infant's oral assessment:  WNL  Positioning:  Football Right breast  LATCH documentation:  Latch:  2 = Grasps breast easily, tongue down, lips flanged, rhythmical sucking.  Audible swallowing:  2 = Spontaneous and intermittent  Type of nipple:  2 = Everted at rest and after stimulation  Comfort (Breast/Nipple):  1 = Filling, red/small blisters or bruises, mild/mod discomfort  Hold (Positioning):  2 = No assistance needed to correctly position infant at breast  LATCH score:  9  Attached assessment:  Deep  Lips flanged:  Yes.    Lips untucked:  Yes.    Suck assessment:  Displays both   Pre-feed weight: 3126,   Post-feed weight:  3142 Amount transferred:  16 ml  Total amount transferred:  46 ml  Mother to offer both breast and switch nursing to keep infant awake for feedings.  Encouraged to massage and do good breast compression to aid in draining breast well Mother has been pumping only 3-4 times daily, I recommend that she start pumping after each feeding and supplementing with all breast milk   Give at least 30 ml of ebm  after each feeding. Advised mother to keep account of all wet and dirty diapers Mother to follow up at [facility] office on  Monday for a weight check Mother aware of available [place] services.

## 2017-02-08 ENCOUNTER — Inpatient Hospital Stay (HOSPITAL_COMMUNITY): Admission: RE | Admit: 2017-02-08 | Payer: Medicaid Other | Source: Ambulatory Visit

## 2017-02-17 ENCOUNTER — Other Ambulatory Visit (INDEPENDENT_AMBULATORY_CARE_PROVIDER_SITE_OTHER): Payer: Medicaid Other | Admitting: *Deleted

## 2017-02-17 ENCOUNTER — Other Ambulatory Visit (HOSPITAL_COMMUNITY)
Admission: RE | Admit: 2017-02-17 | Discharge: 2017-02-17 | Disposition: A | Discharge status: Home / Self Care | Payer: Medicaid Other | Source: Ambulatory Visit | Attending: Obstetrics & Gynecology | Admitting: Obstetrics & Gynecology

## 2017-02-17 DIAGNOSIS — B9689 Other specified bacterial agents as the cause of diseases classified elsewhere: Secondary | ICD-10-CM | POA: Insufficient documentation

## 2017-02-17 DIAGNOSIS — N898 Other specified noninflammatory disorders of vagina: Secondary | ICD-10-CM

## 2017-02-17 DIAGNOSIS — R300 Dysuria: Secondary | ICD-10-CM

## 2017-02-17 DIAGNOSIS — N760 Acute vaginitis: Secondary | ICD-10-CM

## 2017-02-17 NOTE — Progress Notes (Signed)
SUBJECTIVE: Tracey Ray is a 21 y.o. female s/p SVD 17 days ago. She complains of dysuria x 2 days, and yellow, thin vaginal discharge for 1 week(s),  without flank pain, fever, chills. Denies abnormal vaginal bleeding or significant pelvic pain or fever. Denies history of known exposure to STD.   OBJECTIVE: Appears well, in no apparent distress.  Vital signs are normal. Urine dipstick shows negative for all components.    ASSESSMENT:  Dysuria Vaginal Discharge  Vaginal Odor  PLAN: GC, chlamydia, trichomonas, BVAG, CVAG probe sent to lab. Urine culture sent to lab Treatment: To be determined once lab results are received ROV prn if symptoms persist or worsen.

## 2017-02-19 LAB — CERVICOVAGINAL ANCILLARY ONLY
BACTERIAL VAGINITIS: POSITIVE — AB
Candida vaginitis: NEGATIVE
Chlamydia: NEGATIVE
Neisseria Gonorrhea: NEGATIVE
TRICH (WINDOWPATH): NEGATIVE

## 2017-02-19 MED ORDER — METRONIDAZOLE 500 MG PO TABS: 500.0000 mg | ORAL_TABLET | Freq: Two times a day (BID) | ORAL | 0 refills | Status: DC

## 2017-02-19 NOTE — Addendum Note (Signed)
Addended by: Verita Schneiders A on: 02/19/2017 10:02 AM   Modules accepted: Orders

## 2017-02-20 LAB — CULTURE, URINE COMPREHENSIVE

## 2017-03-02 ENCOUNTER — Encounter: Payer: Self-pay | Admitting: Obstetrics & Gynecology

## 2017-03-02 ENCOUNTER — Ambulatory Visit (INDEPENDENT_AMBULATORY_CARE_PROVIDER_SITE_OTHER): Payer: Medicaid Other | Admitting: Obstetrics & Gynecology

## 2017-03-02 ENCOUNTER — Other Ambulatory Visit (HOSPITAL_COMMUNITY)
Admission: RE | Admit: 2017-03-02 | Discharge: 2017-03-02 | Disposition: A | Discharge status: Home / Self Care | Payer: Medicaid Other | Source: Ambulatory Visit | Attending: Obstetrics & Gynecology | Admitting: Obstetrics & Gynecology

## 2017-03-02 DIAGNOSIS — Z3202 Encounter for pregnancy test, result negative: Secondary | ICD-10-CM

## 2017-03-02 DIAGNOSIS — Z30017 Encounter for initial prescription of implantable subdermal contraceptive: Secondary | ICD-10-CM

## 2017-03-02 DIAGNOSIS — Z124 Encounter for screening for malignant neoplasm of cervix: Secondary | ICD-10-CM | POA: Diagnosis not present

## 2017-03-02 DIAGNOSIS — Z3049 Encounter for surveillance of other contraceptives: Secondary | ICD-10-CM | POA: Diagnosis not present

## 2017-03-02 LAB — POCT URINE PREGNANCY: PREG TEST UR: NEGATIVE

## 2017-03-02 MED ORDER — ETONOGESTREL 68 MG ~~LOC~~ IMPL
68.0000 mg | DRUG_IMPLANT | Freq: Once | SUBCUTANEOUS | Status: AC
  Administered 2017-03-02: 68 mg via SUBCUTANEOUS

## 2017-03-02 NOTE — Patient Instructions (Signed)
Nexplanon Instructions After Insertion   Keep bandage clean and dry for 24 hours   May use ice/Tylenol/Ibuprofen for soreness or pain   If you develop fever, drainage or increased warmth from incision site-contact office immediately  Etonogestrel implant What is this medicine? ETONOGESTREL (et oh noe JES trel) is a contraceptive (birth control) device. It is used to prevent pregnancy. It can be used for up to 3 years. This medicine may be used for other purposes; ask your health care provider or pharmacist if you have questions. COMMON BRAND NAME(S): Implanon, Nexplanon What should I tell my health care provider before I take this medicine? They need to know if you have any of these conditions: -abnormal vaginal bleeding -blood vessel disease or blood clots -cancer of the breast, cervix, or liver -depression -diabetes -gallbladder disease -headaches -heart disease or recent heart attack -high blood pressure -high cholesterol -kidney disease -liver disease -renal disease -seizures -tobacco smoker -an unusual or allergic reaction to etonogestrel, other hormones, anesthetics or antiseptics, medicines, foods, dyes, or preservatives -pregnant or trying to get pregnant -breast-feeding How should I use this medicine? This device is inserted just under the skin on the inner side of your upper arm by a health care professional. Talk to your pediatrician regarding the use of this medicine in children. Special care may be needed. Overdosage: If you think you have taken too much of this medicine contact a poison control center or emergency room at once. NOTE: This medicine is only for you. Do not share this medicine with others. What if I miss a dose? This does not apply. What may interact with this medicine? Do not take this medicine with any of the following medications: -amprenavir -bosentan -fosamprenavir This medicine may also interact with the following  medications: -barbiturate medicines for inducing sleep or treating seizures -certain medicines for fungal infections like ketoconazole and itraconazole -grapefruit juice -griseofulvin -medicines to treat seizures like carbamazepine, felbamate, oxcarbazepine, phenytoin, topiramate -modafinil -phenylbutazone -rifampin -rufinamide -some medicines to treat HIV infection like atazanavir, indinavir, lopinavir, nelfinavir, tipranavir, ritonavir -St. John's wort This list may not describe all possible interactions. Give your health care provider a list of all the medicines, herbs, non-prescription drugs, or dietary supplements you use. Also tell them if you smoke, drink alcohol, or use illegal drugs. Some items may interact with your medicine. What should I watch for while using this medicine? This product does not protect you against HIV infection (AIDS) or other sexually transmitted diseases. You should be able to feel the implant by pressing your fingertips over the skin where it was inserted. Contact your doctor if you cannot feel the implant, and use a non-hormonal birth control method (such as condoms) until your doctor confirms that the implant is in place. If you feel that the implant may have broken or become bent while in your arm, contact your healthcare provider. What side effects may I notice from receiving this medicine? Side effects that you should report to your doctor or health care professional as soon as possible: -allergic reactions like skin rash, itching or hives, swelling of the face, lips, or tongue -breast lumps -changes in emotions or moods -depressed mood -heavy or prolonged menstrual bleeding -pain, irritation, swelling, or bruising at the insertion site -scar at site of insertion -signs of infection at the insertion site such as fever, and skin redness, pain or discharge -signs of pregnancy -signs and symptoms of a blood clot such as breathing problems; changes in  vision; chest pain; severe,   sudden headache; pain, swelling, warmth in the leg; trouble speaking; sudden numbness or weakness of the face, arm or leg -signs and symptoms of liver injury like dark yellow or brown urine; general ill feeling or flu-like symptoms; light-colored stools; loss of appetite; nausea; right upper belly pain; unusually weak or tired; yellowing of the eyes or skin -unusual vaginal bleeding, discharge -signs and symptoms of a stroke like changes in vision; confusion; trouble speaking or understanding; severe headaches; sudden numbness or weakness of the face, arm or leg; trouble walking; dizziness; loss of balance or coordination Side effects that usually do not require medical attention (report to your doctor or health care professional if they continue or are bothersome): -acne -back pain -breast pain -changes in weight -dizziness -general ill feeling or flu-like symptoms -headache -irregular menstrual bleeding -nausea -sore throat -vaginal irritation or inflammation This list may not describe all possible side effects. Call your doctor for medical advice about side effects. You may report side effects to FDA at 1-800-FDA-1088. Where should I keep my medicine? This drug is given in a hospital or clinic and will not be stored at home. NOTE: This sheet is a summary. It may not cover all possible information. If you have questions about this medicine, talk to your doctor, pharmacist, or health care provider.  2018 Elsevier/Gold Standard (2016-03-06 11:19:22)  

## 2017-03-02 NOTE — Progress Notes (Signed)
Post Partum Exam  Tracey Ray is a 21 y.o. G69P1001 female who presents for a postpartum visit. She is 4 weeks postpartum following a spontaneous vaginal delivery. I have fully reviewed the prenatal and intrapartum course. The delivery was at 39.6 gestational weeks.  Anesthesia: IV sedation. Postpartum course has been unremarkable. Baby's course has been unremarkable. Baby is feeding by bottle - Similac Advance. Bleeding no bleeding. Bowel function is normal. Bladder function is normal. Patient is not sexually active. Desired contraception method is Nexplanon. Postpartum depression screening:negative.  The following portions of the patient's history were reviewed and updated as appropriate: allergies, current medications, past family history, past medical history, past social history, past surgical history and problem list.  Review of Systems Pertinent items noted in HPI and remainder of comprehensive ROS otherwise negative.    Objective:  Blood pressure (!) 110/55, pulse 65, height 5' (1.524 m), weight 139 lb (63 kg), not currently breastfeeding.  General:  alert and no distress   Breasts:  inspection negative, no nipple discharge or bleeding, no masses or nodularity palpable  Lungs: clear to auscultation bilaterally  Heart:  regular rate and rhythm  Abdomen: soft, non-tender; bowel sounds normal; no masses,  no organomegaly   Vulva:  normal  Vagina: normal vagina, no discharge, exudate, lesion, or erythema  Cervix:  multiparous appearance, no bleeding following Pap and no lesions  Corpus: normal size, contour, position, consistency, mobility, non-tender  Adnexa:  normal adnexa  Rectal Exam: Not performed.        Nexplanon Insertion Procedure Patient identified, informed consent performed, consent signed.   Patient does understand that irregular bleeding is a very common side effect of this medication. She was advised to have backup contraception for one week after placement.  Pregnancy test in clinic today was negative.  Appropriate time out taken.  Patient's left arm was prepped and draped in the usual sterile fashion. The ruler used to measure and mark insertion area.  Patient was prepped with alcohol swab and then injected with 3 ml of 1% lidocaine.  She was prepped with betadine, Nexplanon removed from packaging,  Device confirmed in needle, then inserted full length of needle and withdrawn per handbook instructions. Nexplanon was able to palpated in the patient's arm. There was minimal blood loss.  Patient insertion site covered with guaze and a pressure bandage to reduce any bruising.  The patient tolerated the procedure well and was given post procedure instructions.   Assessment:   Normal postpartum exam. Pap smear done at today's visit. Nexplanon placed today.  Plan:   1. Contraception: Nexplanon placed today, due for removal after 03/02/2020. 2. Will follow up pap smear and manage accordingly. 3. Follow up as needed.    Verita Schneiders, MD, [facility] [place] Attending [place], [facility] for [employer], [place]

## 2017-03-03 LAB — CYTOLOGY - PAP: Diagnosis: NEGATIVE

## 2017-03-18 ENCOUNTER — Other Ambulatory Visit: Payer: Medicaid Other

## 2017-03-18 ENCOUNTER — Other Ambulatory Visit (HOSPITAL_COMMUNITY)
Admission: RE | Admit: 2017-03-18 | Discharge: 2017-03-18 | Disposition: A | Discharge status: Home / Self Care | Payer: Medicaid Other | Source: Ambulatory Visit | Attending: Advanced Practice Midwife | Admitting: Advanced Practice Midwife

## 2017-03-18 DIAGNOSIS — L298 Other pruritus: Secondary | ICD-10-CM | POA: Insufficient documentation

## 2017-03-18 DIAGNOSIS — N898 Other specified noninflammatory disorders of vagina: Secondary | ICD-10-CM

## 2017-03-18 NOTE — Progress Notes (Signed)
Pt is here today c/o vaginal itching and a brownish discharge.  Recently screened for GC/CT with previous pregnancy.  Self swab performed and sent to lab.  Will contact pt with results when available and treat appropriately at that time.

## 2017-03-19 LAB — CERVICOVAGINAL ANCILLARY ONLY
BACTERIAL VAGINITIS: NEGATIVE
Candida vaginitis: NEGATIVE

## 2018-02-12 NOTE — Telephone Encounter (Signed)
Preadmission screen  

## 2018-03-12 ENCOUNTER — Emergency Department (HOSPITAL_COMMUNITY): Admission: EM | Admit: 2018-03-12 | Discharge: 2018-03-12 | Discharge status: Absent without leave | Payer: Self-pay

## 2018-03-12 ENCOUNTER — Emergency Department (HOSPITAL_COMMUNITY)
Admission: EM | Admit: 2018-03-12 | Discharge: 2018-03-12 | Disposition: A | Discharge status: Home / Self Care | Payer: Self-pay | Attending: Emergency Medicine | Admitting: Emergency Medicine

## 2018-03-12 ENCOUNTER — Other Ambulatory Visit: Payer: Self-pay

## 2018-03-12 ENCOUNTER — Emergency Department (HOSPITAL_COMMUNITY): Payer: Self-pay

## 2018-03-12 ENCOUNTER — Encounter (HOSPITAL_COMMUNITY): Payer: Self-pay | Admitting: Emergency Medicine

## 2018-03-12 DIAGNOSIS — R1084 Generalized abdominal pain: Secondary | ICD-10-CM

## 2018-03-12 LAB — URINALYSIS, ROUTINE W REFLEX MICROSCOPIC
Bilirubin Urine: NEGATIVE
Glucose, UA: NEGATIVE mg/dL
HGB URINE DIPSTICK: NEGATIVE
Ketones, ur: NEGATIVE mg/dL
Leukocytes, UA: NEGATIVE
NITRITE: NEGATIVE
Protein, ur: NEGATIVE mg/dL
SPECIFIC GRAVITY, URINE: 1.023 (ref 1.005–1.030)
pH: 6 (ref 5.0–8.0)

## 2018-03-12 LAB — COMPREHENSIVE METABOLIC PANEL
ALBUMIN: 4.3 g/dL (ref 3.5–5.0)
ALK PHOS: 67 U/L (ref 38–126)
ALT: 13 U/L (ref 0–44)
ANION GAP: 8 (ref 5–15)
AST: 17 U/L (ref 15–41)
BUN: 11 mg/dL (ref 6–20)
CHLORIDE: 108 mmol/L (ref 98–111)
CO2: 22 mmol/L (ref 22–32)
CREATININE: 0.48 mg/dL (ref 0.44–1.00)
Calcium: 8.8 mg/dL — ABNORMAL LOW (ref 8.9–10.3)
GFR calc non Af Amer: >60 mL/min (ref >60)
Glucose, Bld: 117 mg/dL — ABNORMAL HIGH (ref 70–99)
POTASSIUM: 3.2 mmol/L — AB (ref 3.5–5.1)
SODIUM: 138 mmol/L (ref 135–145)
Total Bilirubin: 0.7 mg/dL (ref 0.3–1.2)
Total Protein: 7.7 g/dL (ref 6.5–8.1)

## 2018-03-12 LAB — CBC WITH DIFFERENTIAL/PLATELET
Basophils Absolute: 0 10*3/uL (ref 0.0–0.1)
Basophils Relative: 0 %
EOS ABS: 0 10*3/uL (ref 0.0–0.7)
Eosinophils Relative: 0 %
HCT: 39.8 % (ref 36.0–46.0)
HEMOGLOBIN: 13.2 g/dL (ref 12.0–15.0)
LYMPHS ABS: 0.5 10*3/uL — AB (ref 0.7–4.0)
Lymphocytes Relative: 7 %
MCH: 28.4 pg (ref 26.0–34.0)
MCHC: 33.2 g/dL (ref 30.0–36.0)
MCV: 85.8 fL (ref 78.0–100.0)
MONOS PCT: 9 %
Monocytes Absolute: 0.6 10*3/uL (ref 0.1–1.0)
NEUTROS ABS: 6.4 10*3/uL (ref 1.7–7.7)
NEUTROS PCT: 84 %
Platelets: 246 10*3/uL (ref 150–400)
RBC: 4.64 MIL/uL (ref 3.87–5.11)
RDW: 13.1 % (ref 11.5–15.5)
WBC: 7.5 10*3/uL (ref 4.0–10.5)

## 2018-03-12 LAB — LIPASE, BLOOD: Lipase: 25 U/L (ref 11–51)

## 2018-03-12 LAB — I-STAT BETA HCG BLOOD, ED (MC, WL, AP ONLY)

## 2018-03-12 MED ORDER — IOPAMIDOL (ISOVUE-300) INJECTION 61%
100.0000 mL | Freq: Once | INTRAVENOUS | Status: AC | PRN
  Administered 2018-03-12: 100 mL via INTRAVENOUS

## 2018-03-12 MED ORDER — ONDANSETRON HCL 4 MG/2ML IJ SOLN
4.0000 mg | Freq: Once | INTRAMUSCULAR | Status: AC
  Administered 2018-03-12: 4 mg via INTRAVENOUS
  Filled 2018-03-12: qty 2

## 2018-03-12 MED ORDER — IOPAMIDOL (ISOVUE-300) INJECTION 61%
INTRAVENOUS | Status: AC
  Filled 2018-03-12: qty 100

## 2018-03-12 MED ORDER — SODIUM CHLORIDE 0.9 % IV BOLUS
1000.0000 mL | Freq: Once | INTRAVENOUS | Status: AC
  Administered 2018-03-12: 1000 mL via INTRAVENOUS

## 2018-03-12 MED ORDER — MORPHINE SULFATE (PF) 4 MG/ML IV SOLN
4.0000 mg | Freq: Once | INTRAVENOUS | Status: AC
  Administered 2018-03-12: 4 mg via INTRAVENOUS
  Filled 2018-03-12: qty 1

## 2018-03-12 NOTE — ED Notes (Signed)
Pt given crackers and gingerale for PO challenge. Pt tolerating well.

## 2018-03-12 NOTE — ED Provider Notes (Signed)
[facility] DEPT Provider Note   CSN: [id number] Arrival date & time: 03/12/18  1606     History   Chief Complaint Chief Complaint  Patient presents with  . Abdominal Pain    HPI Tracey Ray is a 22 y.o. female who presents for evaluation of epigastric abdominal pain that began today.  Patient reports that earlier today when she woke up, she felt subjective fever but actually measured her temperature.  He states she also had some upper abdominal pain that she described as a constant, dull pain.  She states that she took some Tylenol and then went to work.  Patient reports that at work, she had intermittent subjective fever and chills.  Additionally, she states that her abdominal pain was eating worse.  She states that she did not have any vomiting but did have some nausea.  Additionally, patient reports that when she urinated, she had some pressure in her suprapubic region.  No dysuria or hematuria.  Patient states that her last bowel movement was earlier today was normal.  No blood in her bowel movement.  Patient reports when she is getting the pain, she would start breathing very rapidly because of the pain.  She states that that time, she started getting some numbness tingling sensation in her bilateral hands and feet.  Patient denies any chest pain, vomiting, dysuria, hematuria, vision changes, difficulty speaking.  The history is provided by the patient.    History reviewed. No pertinent past medical history.  Patient Active Problem List   Diagnosis Date Noted  . Normal labor 01/30/2017  . Supervision of normal pregnancy, antepartum 11/17/2016  . Rubella non-immune status, antepartum 11/17/2016    History reviewed. No pertinent surgical history.   OB History    Gravida  1   Para  1   Term  1   Preterm      AB      Living  1     SAB      TAB      Ectopic      Multiple  0   Live Births  1            Home  Medications    Prior to Admission medications   Medication Sig Start Date End Date Taking? Authorizing Provider  ibuprofen (ADVIL,MOTRIN) 200 MG tablet Take 400 mg by mouth every 6 (six) hours as needed for moderate pain.   Yes [provider]  PRESCRIPTION MEDICATION 1 Units by Implant route.   Yes [provider]    Family History Family History  Problem Relation Age of Onset  . Cancer Maternal Grandmother     Social History Social History   Tobacco Use  . Smoking status: Never Smoker  . Smokeless tobacco: Never Used  Substance Use Topics  . Alcohol use: No  . Drug use: No     Allergies   Patient has no known allergies.   Review of Systems Review of Systems  Constitutional: Negative for fever.  Respiratory: Negative for cough and shortness of breath.   Cardiovascular: Negative for chest pain.  Gastrointestinal: Positive for abdominal pain and nausea. Negative for blood in stool, diarrhea and vomiting.  Genitourinary: Negative for dysuria and hematuria.  Neurological: Positive for numbness. Negative for weakness and headaches.  All other systems reviewed and are negative.    Physical Exam Updated Vital Signs BP 91/64 (BP Location: Left Arm)   Pulse 99   Temp 98.3 F (36.8  C)   Resp 14   SpO2 100%   Physical Exam  Constitutional: She is oriented to person, place, and time. She appears well-developed and well-nourished.  Appears anxious but no acute distress  HENT:  Head: Normocephalic and atraumatic.  Mouth/Throat: Oropharynx is clear and moist and mucous membranes are normal.  Eyes: Pupils are equal, round, and reactive to light. Conjunctivae, EOM and lids are normal.  Neck: Full passive range of motion without pain.  Cardiovascular: Normal rate, regular rhythm, normal heart sounds and normal pulses. Exam reveals no gallop and no friction rub.  No murmur heard. Pulses:      Radial pulses are 2+ on the right side, and 2+ on the left  side.       Dorsalis pedis pulses are 2+ on the right side, and 2+ on the left side.  Pulmonary/Chest: Effort normal and breath sounds normal.  Lungs clear to auscultation bilaterally.  Symmetric chest rise.  No wheezing, rales, rhonchi.  Abdominal: Soft. Normal appearance and bowel sounds are normal. There is tenderness. There is CVA tenderness. There is no rigidity, no guarding and no tenderness at McBurney's point.  Tenderness palpation noted in the epigastric and suprapubic region.  No tenderness noted at McBurney's point.  Bilateral CVA tenderness, right greater than left.  Musculoskeletal: Normal range of motion.  Neurological: She is alert and oriented to person, place, and time.  Cranial nerves III-XII intact Follows commands, Moves all extremities  5/5 strength to BUE and BLE  Sensation intact throughout all major nerve distributions Normal finger to nose. No dysdiadochokinesia. No pronator drift. No gait abnormalities  No slurred speech. No facial droop.   Skin: Skin is warm and dry. Capillary refill takes less than 2 seconds.  Psychiatric: She has a normal mood and affect. Her speech is normal.  Nursing note and vitals reviewed.    ED Treatments / Results  Labs (all labs ordered are listed, but only abnormal results are displayed) Labs Reviewed  COMPREHENSIVE METABOLIC PANEL - Abnormal; Notable for the following components:      Result Value   Potassium 3.2 (*)    Glucose, Bld 117 (*)    Calcium 8.8 (*)    All other components within normal limits  CBC WITH DIFFERENTIAL/PLATELET - Abnormal; Notable for the following components:   Lymphs Abs 0.5 (*)    All other components within normal limits  URINALYSIS, ROUTINE W REFLEX MICROSCOPIC  LIPASE, BLOOD  I-STAT BETA HCG BLOOD, ED ([facility], [facility], [facility] ONLY)    EKG None  Radiology Ct Abdomen Pelvis W Contrast  Result Date: 03/12/2018 CLINICAL DATA:  Abdominal pain over the last day.  Recent pregnancy. EXAM: CT ABDOMEN AND  PELVIS WITH CONTRAST TECHNIQUE: Multidetector CT imaging of the abdomen and pelvis was performed using the standard protocol following bolus administration of intravenous contrast. CONTRAST:  180mL ISOVUE-300 IOPAMIDOL (ISOVUE-300) INJECTION 61% COMPARISON:  None. FINDINGS: Lower chest: Normal Hepatobiliary: Normal Pancreas: Normal Spleen: Normal Adrenals/Urinary Tract: Adrenal glands are normal. Kidneys are normal. Bladder is normal. Stomach/Bowel: No evidence of obstruction or inflammatory change. No focal lesion. The appendix is normal. Vascular/Lymphatic: Normal Reproductive: Retroverted postpartum uterus. No adnexal lesion. Normal appearing ovaries. Other: No free fluid or air. Musculoskeletal: Normal IMPRESSION: Normal CT.  No abnormality seen to explain abdominal pain. Electronically Signed   By: Nelson Chimes M.D.   On: 03/12/2018 19:01    Procedures Procedures (including critical care time)  Medications Ordered in ED Medications  iopamidol (ISOVUE-300) 61 %  injection (has no administration in time range)  sodium chloride 0.9 % bolus 1,000 mL (0 mLs Intravenous Stopped 03/12/18 1923)  ondansetron (ZOFRAN) injection 4 mg (4 mg Intravenous Given 03/12/18 1710)  morphine 4 MG/ML injection 4 mg (4 mg Intravenous Given 03/12/18 1710)  iopamidol (ISOVUE-300) 61 % injection 100 mL (100 mLs Intravenous Contrast Given 03/12/18 1844)     Initial Impression / Assessment and Plan / ED Course  I have reviewed the triage vital signs and the nursing notes.  Pertinent labs & imaging results that were available during my care of the patient were reviewed by me and considered in my medical decision making (see chart for details).     22 year old female who presents for evaluation of abdominal pain that began today.  Associate with some nausea.  Additionally, reports that when she had the pain, she started hyperventilating and started having some numbness tingling to bilateral hands and feet.  Reports  subjective fever chills but states she did not check it with a thermometer.  No vomiting, diarrhea, constipation. Patient is afebrile, non-toxic appearing, sitting comfortably on examination table. Vital signs reviewed and stable.  On exam, patient has tenderness to the epigastric region, suprapubic region.  She also has bilateral CVA tenderness, right greater than left.  Consider acute infectious etiology versus UTI versus kidney stone versus viral GI process.  Also consider pyelonephritis.  History/physical exam not concerning for appendicitis, bowel obstruction, perforation. Do not suspect ovarian torsion, TOA given history/physical exam.  Plan to check basic labs.  Fluids, pain medication and antiemetics given.  I-STAT beta negative.  CMP shows potassium of 3.2, glucose of 117.  Otherwise unremarkable.  CBC without any significant leukocytosis, anemia.  Lipase unremarkable.  UA negative for any acute infectious etiology.  CT and pelvis shows no acute allergies.  We will plan to p.o. Challenge.  Unable to tolerate p.o. without any difficulty.  She states that her symptoms have completely resolved.  Repeat abdominal exam is improved.  No tenderness, rigidity.  She states that her pain is no longer present.  Suspect viral process.  Exam and work up not concerning for appendicitis, diverticulitis, perforation, obstruction. Discussed at home supportive therapies with patient.  Encourage primary care follow-up. Patient had ample opportunity for questions and discussion. All patient's questions were answered with full understanding. Strict return precautions discussed. Patient expresses understanding and agreement to plan.   Final Clinical Impressions(s) / ED Diagnoses   Final diagnoses:  Generalized abdominal pain    ED Discharge Orders    None       Desma Mcgregor 03/12/18 2318    Charlesetta Shanks, MD 03/18/18 0004

## 2018-03-12 NOTE — Discharge Instructions (Signed)
You can take Tylenol or Ibuprofen as directed for pain. You can alternate Tylenol and Ibuprofen every 4 hours. If you take Tylenol at 1pm, then you can take Ibuprofen at 5pm. Then you can take Tylenol again at 9pm.   Your primary care doctor in the next 2 to 4 days for further evaluation.  We did have a primary care doctor, you can follow-up with [facility].  Return to the Emergency Department immediately if you experience any worsening abdominal pain, fever, persistent nausea and vomiting, inability keep any food down, pain with urination, blood in your urine or any other worsening or concerning symptoms.

## 2018-03-12 NOTE — ED Notes (Signed)
Discharge instructions reviewed with pt. PT verbalized understanding. PT to follow up with PCP. PIV removed. PT ambulatory to waiting room.

## 2018-03-12 NOTE — ED Triage Notes (Signed)
Patient here from home with complaints of abdominal pain x1 day. Chills all day. Tylenol at 3:40pm. No hx.

## 2018-07-14 IMAGING — CT CT ABD-PELV W/ CM
2 of 4 series · 16 of 46 positions shown, 18 images · IV contrast (iopamidol)
Comparison: None.

CLINICAL DATA: Abdominal pain over the last day.  Recent pregnancy.

EXAM:
CT ABDOMEN AND PELVIS WITH CONTRAST
TECHNIQUE: Multidetector CT imaging of the abdomen and pelvis was performed
using the standard protocol following bolus administration of
intravenous contrast.
CONTRAST:  100mL 533BJX-3DD IOPAMIDOL (533BJX-3DD) INJECTION 61%

[Series 2: axial st · axial · 0.67mm/px · z∈[+1087,+1457]mm · 13 of 84 slices shown, 15 images]
[im 5/84  soft-tissue]
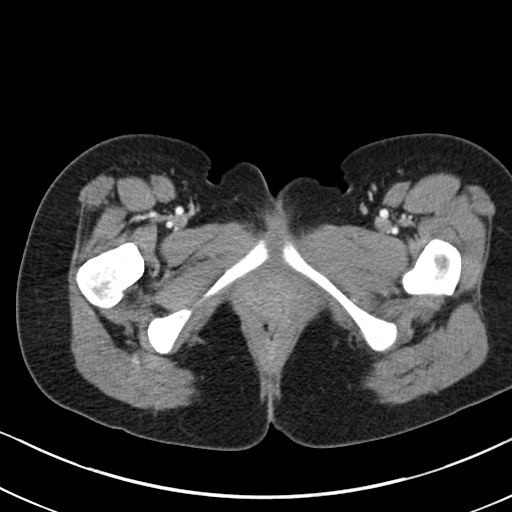
[im 5/84  bone]
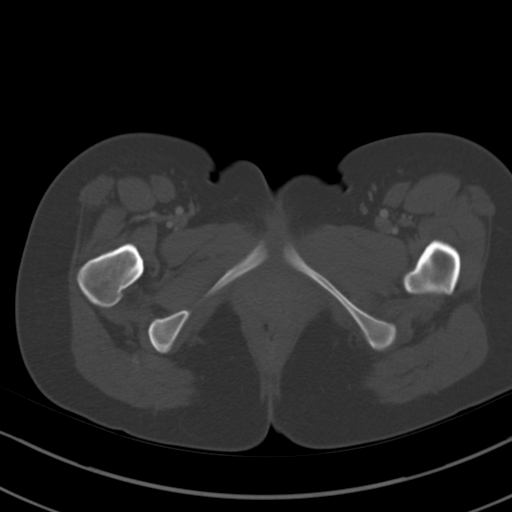
[im 14/84  soft-tissue]
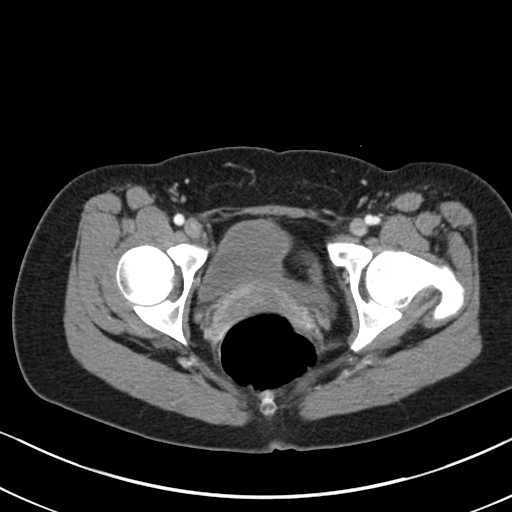
[im 18/84  soft-tissue]
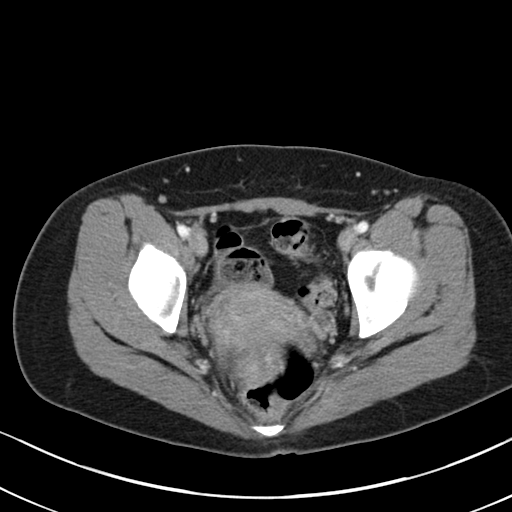
[im 22/84  soft-tissue]
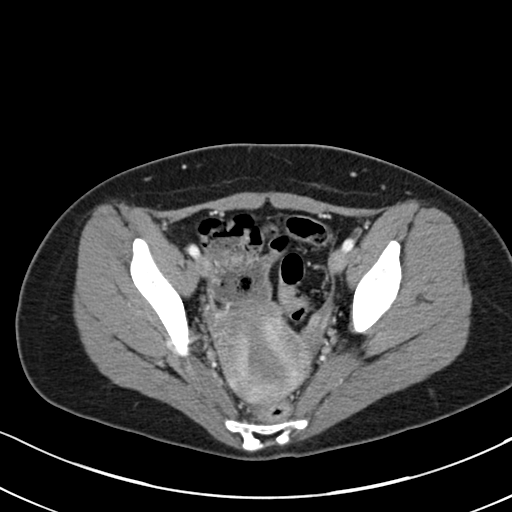
[im 31/84  soft-tissue]
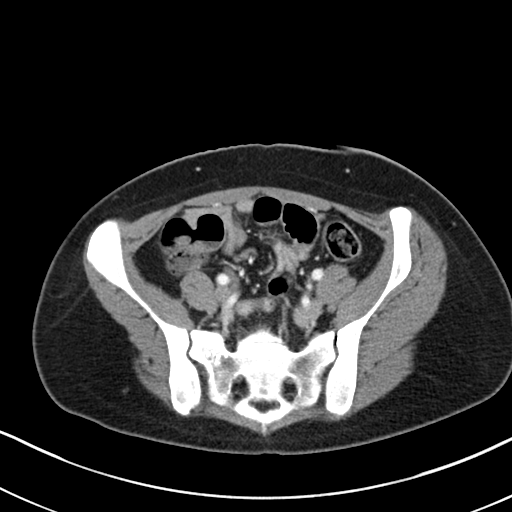
[im 35/84  soft-tissue]
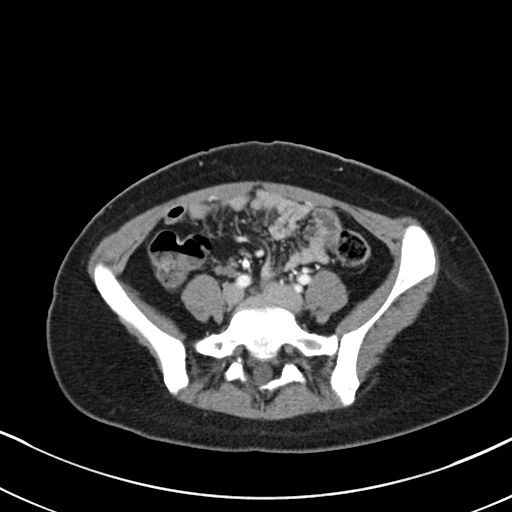
[im 44/84  soft-tissue]
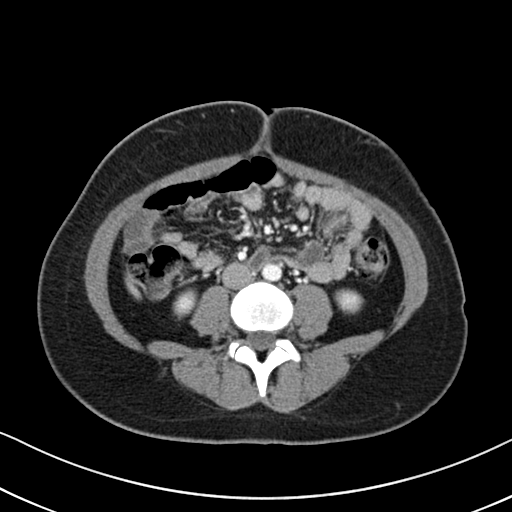
[im 49/84  soft-tissue]
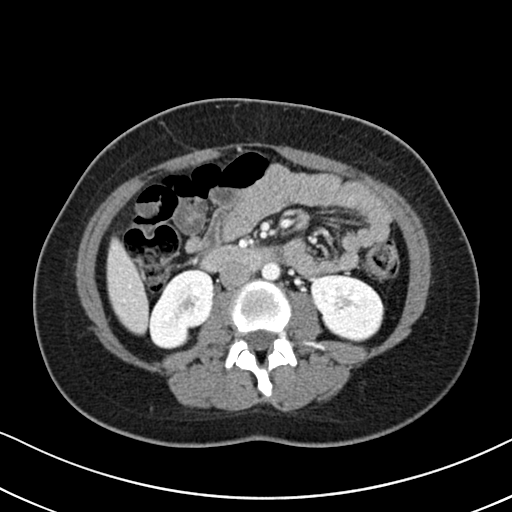
[im 53/84  soft-tissue]
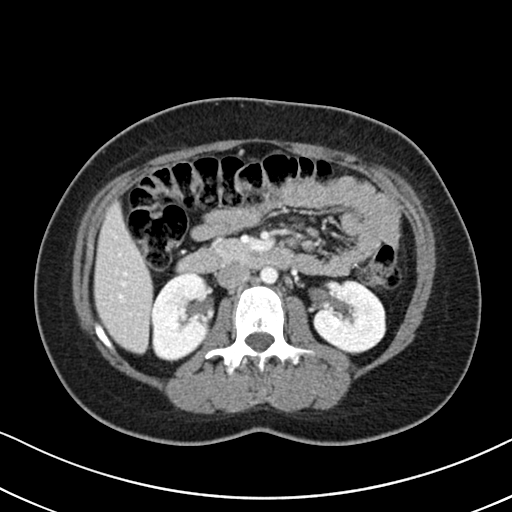
[im 53/84  bone]
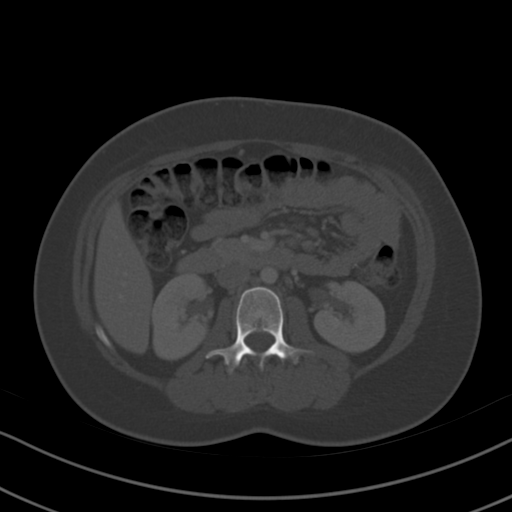
[im 62/84  soft-tissue]
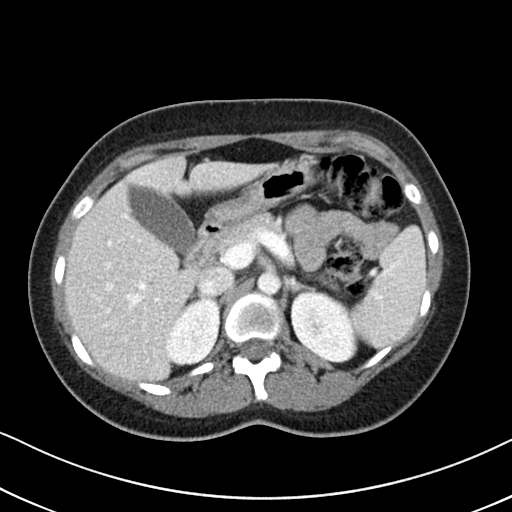
[im 66/84  soft-tissue]
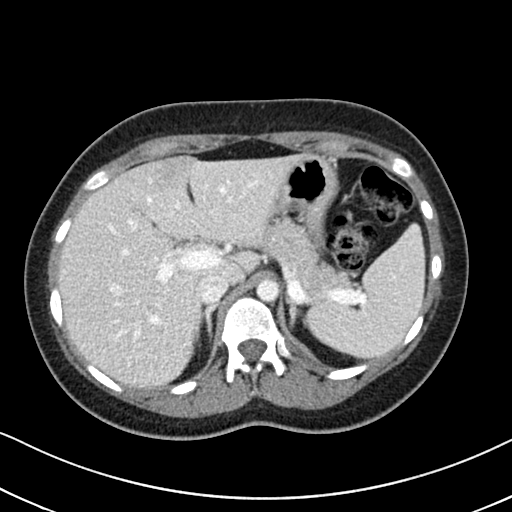
[im 70/84  soft-tissue]
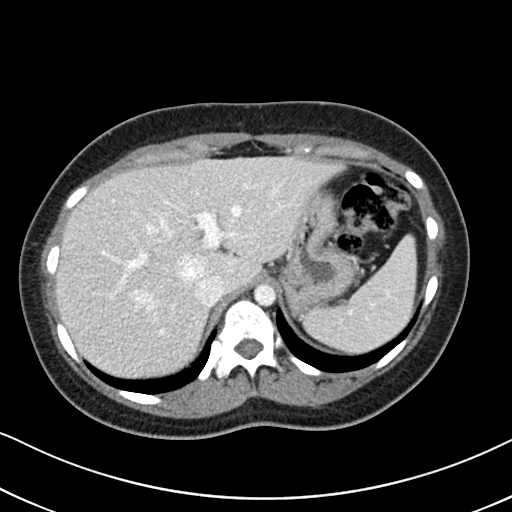
[im 79/84  soft-tissue]
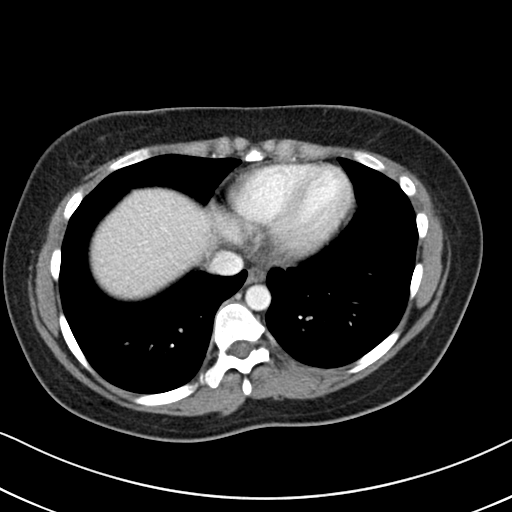

[Series 4: coronal st · coronal · 0.63mm/px · 3 of 84 slices shown]
[im 28/84  soft-tissue]
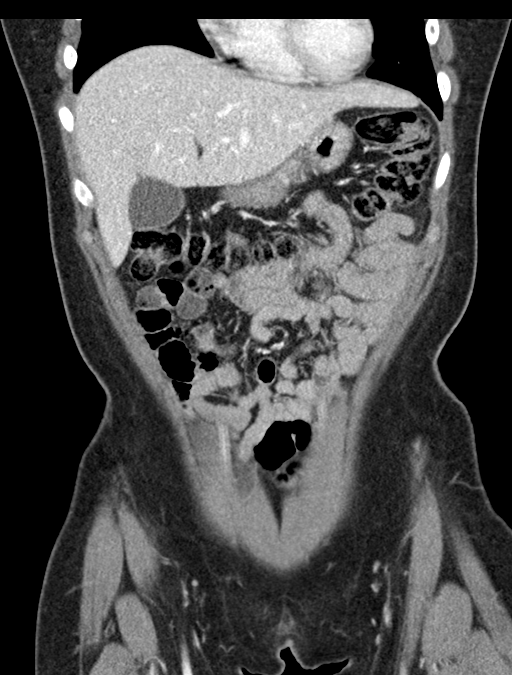
[im 37/84  soft-tissue]
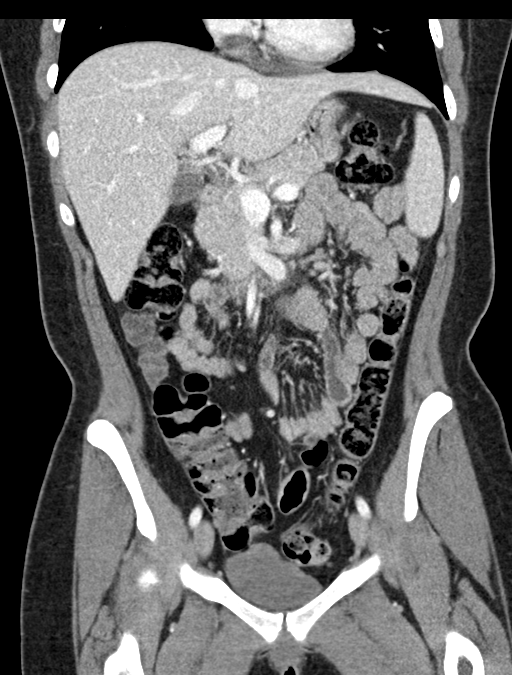
[im 47/84  soft-tissue]
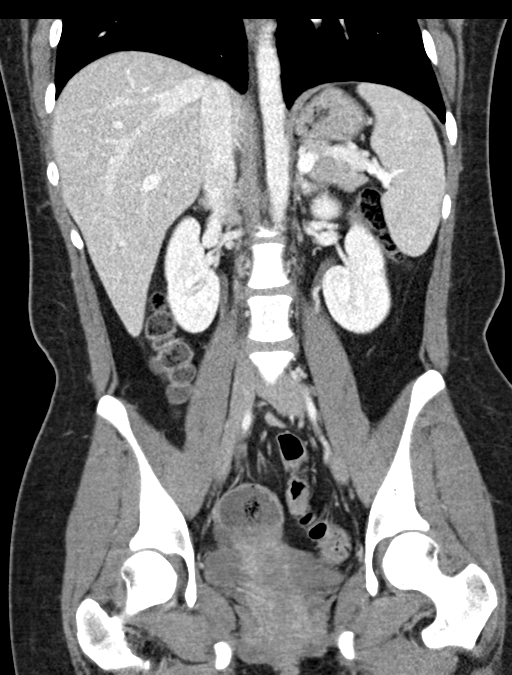

[16 of 46 positions shown; findings below may reference images not displayed]

FINDINGS: Lower chest: Normal

Hepatobiliary: Normal

Pancreas: Normal

Spleen: Normal

Adrenals/Urinary Tract: Adrenal glands are normal. Kidneys are
normal. Bladder is normal.

Stomach/Bowel: No evidence of obstruction or inflammatory change. No
focal lesion. The appendix is normal.

Vascular/Lymphatic: Normal

Reproductive: Retroverted postpartum uterus. No adnexal lesion.
Normal appearing ovaries.

Other: No free fluid or air.

Musculoskeletal: Normal
IMPRESSION: Normal CT.  No abnormality seen to explain abdominal pain.

## 2019-03-24 ENCOUNTER — Other Ambulatory Visit: Payer: Self-pay | Admitting: Nurse Practitioner

## 2019-03-24 ENCOUNTER — Other Ambulatory Visit (HOSPITAL_COMMUNITY)
Admission: RE | Admit: 2019-03-24 | Discharge: 2019-03-24 | Disposition: A | Discharge status: Home / Self Care | Payer: Self-pay | Source: Ambulatory Visit | Attending: Nurse Practitioner | Admitting: Nurse Practitioner

## 2019-03-24 DIAGNOSIS — Z124 Encounter for screening for malignant neoplasm of cervix: Secondary | ICD-10-CM | POA: Insufficient documentation

## 2019-03-28 LAB — CYTOLOGY - PAP
Chlamydia: POSITIVE — AB
Diagnosis: NEGATIVE
Neisseria Gonorrhea: NEGATIVE

## 2019-04-05 ENCOUNTER — Other Ambulatory Visit: Payer: Self-pay

## 2019-04-05 DIAGNOSIS — Z20822 Contact with and (suspected) exposure to covid-19: Secondary | ICD-10-CM

## 2019-04-06 LAB — NOVEL CORONAVIRUS, NAA

## 2019-04-07 ENCOUNTER — Other Ambulatory Visit: Payer: Self-pay

## 2019-04-07 DIAGNOSIS — Z20822 Contact with and (suspected) exposure to covid-19: Secondary | ICD-10-CM

## 2019-04-08 LAB — NOVEL CORONAVIRUS, NAA: SARS-CoV-2, NAA: NOT DETECTED

## 2019-04-08 LAB — SPECIMEN STATUS REPORT

## 2021-02-05 ENCOUNTER — Ambulatory Visit (LOCAL_COMMUNITY_HEALTH_CENTER): Payer: Self-pay | Admitting: Family Medicine

## 2021-02-05 ENCOUNTER — Other Ambulatory Visit: Payer: Self-pay

## 2021-02-05 ENCOUNTER — Encounter: Payer: Self-pay | Admitting: Family Medicine

## 2021-02-05 VITALS — BP 97/66 | HR 73 | Temp 98.4°F | Ht 60.0 in | Wt 133.0 lb

## 2021-02-05 DIAGNOSIS — Z3009 Encounter for other general counseling and advice on contraception: Secondary | ICD-10-CM

## 2021-02-05 DIAGNOSIS — Z3046 Encounter for surveillance of implantable subdermal contraceptive: Secondary | ICD-10-CM

## 2021-02-05 DIAGNOSIS — Z113 Encounter for screening for infections with a predominantly sexual mode of transmission: Secondary | ICD-10-CM

## 2021-02-05 DIAGNOSIS — Z0000 Encounter for general adult medical examination without abnormal findings: Secondary | ICD-10-CM

## 2021-02-05 LAB — WET PREP FOR TRICH, YEAST, CLUE
Trichomonas Exam: NEGATIVE
Yeast Exam: NEGATIVE

## 2021-02-05 NOTE — Progress Notes (Signed)
Per M. Fabio Neighbors FNP-BC, client may self-collect wet prep and gc/chlamydia cx if no vaginal symptoms. Per client, is having no vaginal issues. Counseled regarding self-collection and client stated obtained without difficulty. Wet mount reviewed - negative. Rich Number, RN

## 2021-02-05 NOTE — Progress Notes (Signed)
Family Planning Visit- Repeat Yearly Visit  Subjective:  Tracey Ray is a 25 y.o. G1P1001  being seen today for an annual wellness visit and to discuss contraception options.   The patient is currently using Nexplanon for pregnancy prevention. Patient does want a pregnancy in the next year. Patient has the following medical problems: has Supervision of normal pregnancy, antepartum; Rubella non-immune status, antepartum; and Normal labor on their problem list.  Chief Complaint  Patient presents with  . Annual Exam  . Nexplanon removal    Patient reports here for physical, removal of BC and STI screening. Reports occasional HA and weight gain   Patient denies any other problems or concerns.   See flowsheet for other program required questions.   Body mass index is 25.97 kg/m. - Patient is eligible for diabetes screening based on BMI and age >53?  not applicable IR4E ordered? not applicable  Patient reports 1 of partners in last year. Desires STI screening?  Yes   Has patient been screened once for HCV in the past?  No  No results found for: HCVAB  Does the patient have current of drug use, have a partner with drug use, and/or has been incarcerated since last result? No  If yes-- Screen for HCV through [facility]   Does the patient meet criteria for HBV testing? Yes  Criteria:  -Household, sexual or needle sharing contact with HBV -History of drug use -HIV positive -Those with known Hep C   Health Maintenance Due  Topic Date Due  . COVID-19 Vaccine (1) Never done  . HPV VACCINES (1 - 2-dose series) Never done  . Hepatitis C Screening  Never done    Review of Systems  Constitutional: Negative for chills, fever, malaise/fatigue and weight loss.  HENT: Negative for congestion, hearing loss and sore throat.   Eyes: Negative for blurred vision, double vision and photophobia.  Respiratory: Negative for shortness of breath.   Cardiovascular: Negative for  chest pain.  Gastrointestinal: Negative for abdominal pain, blood in stool, constipation, diarrhea, heartburn, nausea and vomiting.  Genitourinary: Negative for dysuria and frequency.  Musculoskeletal: Negative for back pain, joint pain and neck pain.  Skin: Negative for itching and rash.  Neurological: Positive for headaches. Negative for dizziness and weakness.  Endo/Heme/Allergies: Does not bruise/bleed easily.  Psychiatric/Behavioral: Negative for depression, substance abuse and suicidal ideas.    The following portions of the patient's history were reviewed and updated as appropriate: allergies, current medications, past family history, past medical history, past social history, past surgical history and problem list. Problem list updated.  Objective:   Vitals:   02/05/21 1434  BP: 97/66  Pulse: 73  Temp: 98.4 F (36.9 C)  Weight: 133 lb (60.3 kg)  Height: 5' (1.524 m)    Physical Exam Vitals and nursing note reviewed.  Constitutional:      Appearance: Normal appearance.  HENT:     Head: Normocephalic and atraumatic.     Mouth/Throat:     Mouth: Mucous membranes are moist.     Dentition: Normal dentition. No dental caries.     Pharynx: No oropharyngeal exudate or posterior oropharyngeal erythema.  Eyes:     General: No scleral icterus. Neck:     Thyroid: No thyroid mass, thyromegaly or thyroid tenderness.  Cardiovascular:     Rate and Rhythm: Normal rate.     Pulses: Normal pulses.  Pulmonary:     Effort: Pulmonary effort is normal.  Chest:  Comments: Breasts:        Right: Normal. No swelling, mass, nipple discharge, skin change or tenderness.        Left: Normal. No swelling, mass, nipple discharge, skin change or tenderness.   Abdominal:     General: Abdomen is flat. Bowel sounds are normal.     Palpations: Abdomen is soft.  Genitourinary:    Comments: Deferred, pt self collected samples.  Musculoskeletal:        General: Normal range of motion.      Cervical back: Normal range of motion and neck supple.  Lymphadenopathy:     Cervical: No cervical adenopathy.  Skin:    General: Skin is warm and dry.     Comments: Multiple tattoos present   Neurological:     General: No focal deficit present.     Mental Status: She is alert.       Assessment and Plan:  Tracey Ray is a 25 y.o. female [place] presenting to the [facility] Department for an yearly wellness and contraception visit   1. Routine general medical examination at a health care facility Well woman exam CBE Pap due 03/2022 Desires pregnancy, and nexplanon removed- inserted 03/02/2017.   2. Family planning Contraception counseling: Reviewed all forms of birth control options in the tiered based approach. available including abstinence; over the counter/barrier methods; hormonal contraceptive medication including pill, patch, ring, injection,contraceptive implant, ECP; hormonal and nonhormonal IUDs; permanent sterilization options including vasectomy and the various tubal sterilization modalities. Risks, benefits, and typical effectiveness rates were reviewed.  Questions were answered.  Written information was available for the patient to review.  Patient desires pregnancy,  Emphasized use of condoms  for STI prevention.  Patient was not offered ECP based on desiring pregnancy.   - encouraged to start taking PNV   3. Screening examination for venereal disease Denies s/sx of infection  - HIV [place] [facility] - Syphilis Serology, [facility] - Chlamydia/Gonorrhea [place] - WET PREP FOR [place], YEAST, CLUE  4. Nexplanon removal  Patient identified, informed consent performed, consent signed.   Appropriate time out taken. Nexplanon site identified.  Area prepped in usual sterile fashon. 3 ml of 1% lidocaine with Epinephrine was used to anesthetize the area at the distal end of the implant and along implant site. A small stab incision was made right  beside the implant on the distal portion.  The Nexplanon rod was grasped using hemostats/manual and removed without difficulty.  There was minimal blood loss. There were no complications.  Steri-strips were applied over the small incision.  A pressure bandage was applied to reduce any bruising.  The patient tolerated the procedure well and was given post procedure instructions.   Counseled patient to take OTC analgesic starting as soon as lidocaine starts to wear off and take regularly for at least 48 hr to decrease discomfort.  Specifically to take with food or milk to decrease stomach upset and for IB 600 mg (3 tablets) every 6 hrs; IB 800 mg (4 tablets) every 8 hrs; or Aleve 2 tablets every 12 hrs.     Return in about 1 year (around 02/05/2022) for annual well woman exam.  No future appointments.  Junious Dresser, FNP

## 2021-02-07 LAB — HM HIV SCREENING LAB: HM HIV Screening: NEGATIVE

## 2021-09-24 ENCOUNTER — Ambulatory Visit: Payer: Self-pay

## 2022-03-25 ENCOUNTER — Other Ambulatory Visit: Payer: Self-pay | Admitting: Physician Assistant

## 2022-03-25 DIAGNOSIS — D360 Benign neoplasm of lymph nodes: Secondary | ICD-10-CM

## 2022-04-01 ENCOUNTER — Ambulatory Visit
Admission: RE | Admit: 2022-04-01 | Discharge: 2022-04-01 | Disposition: A | Discharge status: Home / Self Care | Payer: Self-pay | Source: Ambulatory Visit | Attending: Physician Assistant | Admitting: Physician Assistant

## 2022-04-01 DIAGNOSIS — D360 Benign neoplasm of lymph nodes: Secondary | ICD-10-CM | POA: Insufficient documentation

## 2024-02-26 ENCOUNTER — Ambulatory Visit
Admission: RE | Admit: 2024-02-26 | Discharge: 2024-02-26 | Disposition: A | Discharge status: Home / Self Care | Source: Ambulatory Visit

## 2024-02-26 ENCOUNTER — Encounter: Payer: Self-pay | Admitting: Intensive Care

## 2024-02-26 ENCOUNTER — Emergency Department

## 2024-02-26 ENCOUNTER — Emergency Department
Admission: EM | Admit: 2024-02-26 | Discharge: 2024-02-26 | Disposition: A | Discharge status: Home / Self Care | Attending: Emergency Medicine | Admitting: Emergency Medicine

## 2024-02-26 ENCOUNTER — Other Ambulatory Visit: Payer: Self-pay

## 2024-02-26 VITALS — BP 116/82 | HR 100 | Temp 98.1°F | Resp 18

## 2024-02-26 DIAGNOSIS — Z3201 Encounter for pregnancy test, result positive: Secondary | ICD-10-CM

## 2024-02-26 DIAGNOSIS — O4690 Antepartum hemorrhage, unspecified, unspecified trimester: Secondary | ICD-10-CM | POA: Diagnosis present

## 2024-02-26 DIAGNOSIS — Z3A00 Weeks of gestation of pregnancy not specified: Secondary | ICD-10-CM | POA: Insufficient documentation

## 2024-02-26 DIAGNOSIS — R1030 Lower abdominal pain, unspecified: Secondary | ICD-10-CM | POA: Diagnosis not present

## 2024-02-26 DIAGNOSIS — N939 Abnormal uterine and vaginal bleeding, unspecified: Secondary | ICD-10-CM | POA: Diagnosis not present

## 2024-02-26 DIAGNOSIS — E876 Hypokalemia: Secondary | ICD-10-CM | POA: Diagnosis not present

## 2024-02-26 DIAGNOSIS — O209 Hemorrhage in early pregnancy, unspecified: Secondary | ICD-10-CM

## 2024-02-26 LAB — COMPREHENSIVE METABOLIC PANEL WITH GFR
ALT: 18 U/L (ref 0–44)
AST: 15 U/L (ref 15–41)
Albumin: 4.4 g/dL (ref 3.5–5.0)
Alkaline Phosphatase: 48 U/L (ref 38–126)
Anion gap: 9 (ref 5–15)
BUN: 12 mg/dL (ref 6–20)
CO2: 23 mmol/L (ref 22–32)
Calcium: 9.1 mg/dL (ref 8.9–10.3)
Chloride: 107 mmol/L (ref 98–111)
Creatinine, Ser: 0.6 mg/dL (ref 0.44–1.00)
GFR, Estimated: >60 mL/min (ref >60)
Glucose, Bld: 93 mg/dL (ref 70–99)
Potassium: 3.2 mmol/L — ABNORMAL LOW (ref 3.5–5.1)
Sodium: 139 mmol/L (ref 135–145)
Total Bilirubin: 0.6 mg/dL (ref 0.0–1.2)
Total Protein: 7.9 g/dL (ref 6.5–8.1)

## 2024-02-26 LAB — POCT URINALYSIS DIP (MANUAL ENTRY)
Glucose, UA: NEGATIVE mg/dL
Leukocytes, UA: NEGATIVE
Nitrite, UA: NEGATIVE
Protein Ur, POC: 30 mg/dL — AB
Spec Grav, UA: >=1.03 — AB (ref 1.010–1.025)
Urobilinogen, UA: 0.2 U/dL
pH, UA: 5.5 (ref 5.0–8.0)

## 2024-02-26 LAB — CBC
HCT: 38.9 % (ref 36.0–46.0)
Hemoglobin: 12.9 g/dL (ref 12.0–15.0)
MCH: 29.5 pg (ref 26.0–34.0)
MCHC: 33.2 g/dL (ref 30.0–36.0)
MCV: 89 fL (ref 80.0–100.0)
Platelets: 312 10*3/uL (ref 150–400)
RBC: 4.37 MIL/uL (ref 3.87–5.11)
RDW: 13.1 % (ref 11.5–15.5)
WBC: 7.8 10*3/uL (ref 4.0–10.5)
nRBC: 0 % (ref 0.0–0.2)

## 2024-02-26 LAB — HCG, QUANTITATIVE, PREGNANCY: hCG, Beta Chain, Quant, S: 3185 m[IU]/mL — ABNORMAL HIGH (ref <5)

## 2024-02-26 LAB — ABO/RH: ABO/RH(D): O POS

## 2024-02-26 LAB — POCT URINE PREGNANCY: Preg Test, Ur: POSITIVE — AB

## 2024-02-26 LAB — POC URINE PREG, ED: Preg Test, Ur: POSITIVE — AB

## 2024-02-26 MED ORDER — POTASSIUM CHLORIDE CRYS ER 20 MEQ PO TBCR
40.0000 meq | EXTENDED_RELEASE_TABLET | Freq: Once | ORAL | Status: AC
  Administered 2024-02-26: 40 meq via ORAL
  Filled 2024-02-26: qty 2

## 2024-02-26 NOTE — ED Triage Notes (Signed)
 Patient c/o lower abdominal cramping and vaginal bleeding. Positive pregnancy test on Wednesday.  Heavy vaginal bleeding Wednesday and then spotting that started last night. Second pregnancy.   LMP 5/18

## 2024-02-26 NOTE — Discharge Instructions (Signed)
 You are seen in the emergency department today for evaluation of your vaginal bleeding.  As we discussed, your ultrasound was not able to clearly see your pregnancy.  This could be because your pregnancy is too early or because you had had a miscarriage.  Please have your hCG level drawn in 48 hours to see the trend.  I have included information for follow-up with OB/GYN, but you can also see your primary care doctor if they are able to perform this.  Return to the ER for any new or worsening symptoms.

## 2024-02-26 NOTE — Discharge Instructions (Signed)
 You had a positive pregnancy test here.  Given your symptoms it is very important that you have an ultrasound to make sure that you do not have an ectopic pregnancy or some other complication.  We do not have these resources in urgent care.  Please go directly to the emergency room for further evaluation and management.

## 2024-02-26 NOTE — ED Triage Notes (Signed)
 Patient to Urgent Care with complaints of lower abdominal cramping and vaginal bleeding.   Reports positive pregnancy test on Wednesday. Reports she started experiencing pain (cramping) and bleeding. Bleeding has decreased to spotting now.   LMP 5/18.

## 2024-02-26 NOTE — ED Notes (Signed)
 See triage note  Presents with some abd cramping  She is having some vaginal bleeding  states is was heavy 2 days ago  Now spotting

## 2024-02-26 NOTE — ED Provider Notes (Addendum)
 Tracey Ray    CSN: [id number] Arrival date & time: 02/26/24  1321      History   Chief Complaint Chief Complaint  Patient presents with   Abdominal Pain    Entered by patient    HPI Tracey Ray is a 28 y.o. female.   Patient presents today for evaluation of lower abdominal pain and vaginal bleeding.  She was seen by her family medicine provider on Wednesday (02/24/2024) and had a positive pregnancy test.  After leaving the clinic she had some vaginal bleeding that was initially heavy.  This has improved but she continues to have spotting and is having to use a panty liner.  She is now experiencing some lower abdominal pain/cramping that is rated 6 on a 0-10 pain scale, described as cramping, no alleviating factors identified.  She has been pregnant 1 time before and had a normal pregnancy.  She has not had a miscarriage in the past.  She denies any fever, nausea, vomiting, diarrhea.  She denies any urinary symptoms.  LMP 01/17/2024.  She has not seen an OB/GYN or had an ultrasound to confirm intrauterine pregnancy.    Past Medical History:  Diagnosis Date   History of anemia    History of chlamydia    History of urinary tract infection    Migraines    Normal labor 01/30/2017   Supervision of normal pregnancy, antepartum 11/17/2016      Clinic [facility] Prenatal Labs Dating LMP Blood type: O/Positive/-- (01/31 0000)  Genetic Screen 1 Screen:    AFP:     Quad:     NIPS: Antibody:Negative (01/31 0000) Anatomic US   normal Rubella: Nonimmune (01/31 0000) GTT  Third trimester: normal 1 hour RPR: Nonreactive (01/31 0000)  Flu vaccine  2018 HBsAg: Negative (01/31 0000)  TDaP vaccine   3/18                                             There are no active problems to display for this patient.   Past Surgical History:  Procedure Laterality Date   Denies surgical history      OB History     Gravida  1   Para  1   Term  1   Preterm      AB       Living  1      SAB      IAB      Ectopic      Multiple  0   Live Births  1            Home Medications    Prior to Admission medications   Medication Sig Start Date End Date Taking? Authorizing Provider  MOUNJARO 5 MG/0.5ML Pen  02/09/24  Yes [provider]  ibuprofen  (ADVIL ,MOTRIN ) 200 MG tablet Take 400 mg by mouth every 6 (six) hours as needed for moderate pain. Patient not taking: Reported on 02/05/2021    [provider]  PRESCRIPTION MEDICATION 1 Units by Implant route.    [provider]    Family History Family History  Problem Relation Age of Onset   Cancer Maternal Grandmother    Diabetes Paternal Grandmother    Diabetes Maternal Grandfather    Healthy Father    Diabetes Mother    Hypertension Mother    Healthy Son  Social History Social History   Tobacco Use   Smoking status: Former    Current packs/day: 0.00    Types: Cigarettes    Start date: 2019    Quit date: 2020    Years since quitting: 5.4   Smokeless tobacco: Never   Tobacco comments:    Occasional cigarette use when was smoking.  Vaping Use   Vaping status: Never Used  Substance Use Topics   Alcohol use: Yes    Comment: Last ETOH use 12/2020.   Drug use: Not Currently    Types: Marijuana    Comment: Last marijuana use one year ago in 2021.     Allergies   Patient has no known allergies.   Review of Systems Review of Systems  Constitutional:  Positive for activity change. Negative for appetite change, fatigue and fever.  Gastrointestinal:  Positive for abdominal pain. Negative for diarrhea, nausea and vomiting.  Genitourinary:  Positive for pelvic pain and vaginal bleeding. Negative for dysuria, frequency, urgency, vaginal discharge and vaginal pain.     Physical Exam Triage Vital Signs ED Triage Vitals  Encounter Vitals Group     BP 02/26/24 1340 116/82     Girls Systolic BP Percentile --      Girls Diastolic BP Percentile --      Boys  Systolic BP Percentile --      Boys Diastolic BP Percentile --      Pulse Rate 02/26/24 1340 100     Resp 02/26/24 1340 18     Temp 02/26/24 1340 98.1 F (36.7 C)     Temp src --      SpO2 02/26/24 1340 96 %     Weight --      Height --      Head Circumference --      Peak Flow --      Pain Score 02/26/24 1337 6     Pain Loc --      Pain Education --      Exclude from Growth Chart --    No data found.  Updated Vital Signs BP 116/82   Pulse 100   Temp 98.1 F (36.7 C)   Resp 18   LMP 01/17/2024   SpO2 96%   Visual Acuity Right Eye Distance:   Left Eye Distance:   Bilateral Distance:    Right Eye Near:   Left Eye Near:    Bilateral Near:     Physical Exam Vitals reviewed.  Constitutional:      General: She is awake. She is not in acute distress.    Appearance: Normal appearance. She is well-developed. She is not ill-appearing.     Comments: Very pleasant female appears stated age in no acute distress sitting comfortably in exam room  HENT:     Head: Normocephalic and atraumatic.   Cardiovascular:     Rate and Rhythm: Normal rate and regular rhythm.     Heart sounds: Normal heart sounds, S1 normal and S2 normal. No murmur heard. Pulmonary:     Effort: Pulmonary effort is normal.     Breath sounds: Normal breath sounds. No wheezing, rhonchi or rales.     Comments: Clear to auscultation bilaterally Abdominal:     General: Bowel sounds are normal.     Palpations: Abdomen is soft.     Tenderness: There is abdominal tenderness in the right lower quadrant, suprapubic area and left lower quadrant. There is no right CVA tenderness, left CVA tenderness, guarding or  rebound. Negative signs include Rovsing's sign and McBurney's sign.     Comments: Tenderness palpation throughout lower abdomen.  No evidence of acute abdomen on physical exam.   Psychiatric:        Behavior: Behavior is cooperative.      UC Treatments / Results  Labs (all labs ordered are listed, but  only abnormal results are displayed) Labs Reviewed  POCT URINALYSIS DIP (MANUAL ENTRY) - Abnormal; Notable for the following components:      Result Value   Clarity, UA turbid (*)    Bilirubin, UA small (*)    Ketones, POC UA moderate (40) (*)    Spec Grav, UA >=1.030 (*)    Blood, UA moderate (*)    Protein Ur, POC =30 (*)    All other components within normal limits  POCT URINE PREGNANCY - Abnormal; Notable for the following components:   Preg Test, Ur Positive (*)    All other components within normal limits    EKG   Radiology No results found.  Procedures Procedures (including critical care time)  Medications Ordered in UC Medications - No data to display  Initial Impression / Assessment and Plan / UC Course  I have reviewed the triage vital signs and the nursing notes.  Pertinent labs & imaging results that were available during my care of the patient were reviewed by me and considered in my medical decision making (see chart for details).     Patient is well-appearing, afebrile, nontoxic, nontachycardic.  She had a positive pregnancy test in clinic.  Her UA showed blood and ketones consistent with decreased oral intake but no evidence of infection.  Given her vaginal bleeding, positive pregnancy test, lower abdominal pain I recommend she go to the emergency room for further evaluation and management  since we do not have imaging capabilities in urgent care.  She was agreeable and will go directly to [facility] for further evaluation and management.  She was stable at time of discharge and safe for private transport.  She declined EMS or having someone come get her as she was feeling well and that she could drive herself.  She was stable to time of discharge.  Final Clinical Impressions(s) / UC Diagnoses   Final diagnoses:  Lower abdominal pain  Vaginal bleeding  Positive pregnancy test     Discharge Instructions      You had a positive pregnancy test here.  Given your  symptoms it is very important that you have an ultrasound to make sure that you do not have an ectopic pregnancy or some other complication.  We do not have these resources in urgent care.  Please go directly to the emergency room for further evaluation and management.     ED Prescriptions   None    PDMP not reviewed this encounter.   Sherrell Rocky POUR, PA-C 02/26/24 1400    Brandin Dilday, Rocky POUR, PA-C 02/26/24 1401

## 2024-02-26 NOTE — ED Provider Notes (Signed)
 [facility] Provider Note    Event Date/Time   First MD Initiated Contact with Patient 02/26/24 1517     (approximate)   History   Vaginal Bleeding   HPI  Tracey Ray is a 28 year old G2, P1 presenting to the emergency department for evaluation of vaginal bleeding.  On Wednesday, patient had a home positive pregnancy test.  Later that day she had onset of vaginal bleeding similar to a menstrual cycle.  Since that time, her bleeding is improved but she has continued to have some spotting.  She saw her primary care doctor on Wednesday who took a beta-hCG level and she had a repeat drawn today but she does not know the results yet.  However she did have some worsening cramping today and so she presented to urgent care for further evaluation but was directed to the ER.  She has not had an ultrasound of this pregnancy.  LMP May 18.      Physical Exam   Triage Vital Signs: ED Triage Vitals [02/26/24 1418]  Encounter Vitals Group     BP 108/84     Girls Systolic BP Percentile      Girls Diastolic BP Percentile      Boys Systolic BP Percentile      Boys Diastolic BP Percentile      Pulse Rate 91     Resp 16     Temp 98.6 F (37 C)     Temp Source Oral     SpO2 100 %     Weight 125 lb (56.7 kg)     Height 4' 9 (1.448 m)     Head Circumference      Peak Flow      Pain Score 5     Pain Loc      Pain Education      Exclude from Growth Chart     Most recent vital signs: Vitals:   02/26/24 1418  BP: 108/84  Pulse: 91  Resp: 16  Temp: 98.6 F (37 C)  SpO2: 100%     General: Awake, interactive  CV:  Regular rate, good peripheral perfusion.  Resp:  Unlabored respirations.  Abd:  Nondistended, soft, mild lower abdominal tenderness without rebound or guarding GU:  Pelvic declined Neuro:  Symmetric facial movement, fluid speech   ED Results / Procedures / Treatments   Labs (all labs ordered are listed, but only abnormal  results are displayed) Labs Reviewed  HCG, QUANTITATIVE, PREGNANCY - Abnormal; Notable for the following components:      Result Value   hCG, Beta Chain, Quant, S 3,185 (*)    All other components within normal limits  COMPREHENSIVE METABOLIC PANEL WITH GFR - Abnormal; Notable for the following components:   Potassium 3.2 (*)    All other components within normal limits  POC URINE PREG, ED - Abnormal; Notable for the following components:   Preg Test, Ur Positive (*)    All other components within normal limits  CBC  ABO/RH     EKG EKG independently reviewed and interpreted by myself demonstrates:    RADIOLOGY Imaging independently reviewed and interpreted by myself demonstrates:  Ultrasound demonstrates thickened endometrium without evidence of IUP  Formal Radiology Read:  US  OB LESS THAN 14 WEEKS WITH OB TRANSVAGINAL Result Date: 02/26/2024 CLINICAL DATA:  Vaginal bleeding x3 days. EXAM: OBSTETRIC <14 WK US  AND TRANSVAGINAL OB US  TECHNIQUE: Both transabdominal and transvaginal ultrasound examinations were performed for complete evaluation of  the gestation as well as the maternal uterus, adnexal regions, and pelvic cul-de-sac. Transvaginal technique was performed to assess early pregnancy. COMPARISON:  None Available. FINDINGS: Intrauterine gestational sac: None Yolk sac:  Not Visualized. Embryo:  Not Visualized. Cardiac Activity: Not Visualized. Heart Rate: N/A  bpm Maternal uterus/adnexae: The uterus measures 9.3 cm x 5.5 cm x 6.7 cm (volume 178.46 mm). The endometrium measures 23.3 mm in thickness. A 1.8 cm x 1.5 cm x 2.1 cm corpus luteum cyst is seen within an otherwise normal appearing right ovary. The left ovary is visualized and is normal in appearance. No pelvic free fluid is seen. IMPRESSION: 1. Thickened endometrium without evidence of an intrauterine pregnancy. Correlation with follow-up pelvic ultrasound and serial beta HCG levels is recommended if this remains of clinical  concern. 2. Right corpus luteum cyst. Electronically Signed   By: Suzen Dials M.D.   On: 02/26/2024 16:21    PROCEDURES:  Critical Care performed: No  Procedures   MEDICATIONS ORDERED IN ED: Medications  potassium chloride SA (KLOR-CON M) CR tablet 40 mEq (has no administration in time range)     IMPRESSION / MDM / ASSESSMENT AND PLAN / ED COURSE  I reviewed the triage vital signs and the nursing notes.  Differential diagnosis includes, but is not limited to, ectopic pregnancy, threatened miscarriage, subchorionic hemorrhage  Patient's presentation is most consistent with acute presentation with potential threat to life or bodily function.  28 year old female presenting to the emergency department for evaluation of vaginal bleeding.  Stable vitals on presentation.  Reassuring CBC with hemoglobin of 12.9.  CMP with mild hypokalemia, orally repleted.  O+ blood type, no indication for RhoGAM.  hCG 3185, I am unable to see the her labs from her primary care office in our system.  Differential includes pregnancy too early to see on ultrasound, miscarriage, ectopic cannot be ruled out.  Patient has not yet been seen by OB/GYN, but it does appear that her primary care doctor has been drawing beta-hCG levels for her.  I discussed obtaining a repeat level in 48 hours.  She was given information for follow-up with OB/GYN if her primary care doctor is not able to perform this.  She declines any pain control for her cramping.  Strict return precautions were provided.  Patient was discharged in stable condition.     FINAL CLINICAL IMPRESSION(S) / ED DIAGNOSES   Final diagnoses:  Vaginal bleeding in pregnancy     Rx / DC Orders   ED Discharge Orders     None        Note:  This document was prepared using Dragon voice recognition software and may include unintentional dictation errors.   Levander Slate, MD 02/26/24 [phone]

## 2024-03-22 ENCOUNTER — Other Ambulatory Visit: Payer: Self-pay | Admitting: Primary Care

## 2024-03-22 DIAGNOSIS — Z3201 Encounter for pregnancy test, result positive: Secondary | ICD-10-CM

## 2024-03-24 ENCOUNTER — Ambulatory Visit
Admission: RE | Admit: 2024-03-24 | Discharge: 2024-03-24 | Disposition: A | Discharge status: Home / Self Care | Source: Ambulatory Visit | Attending: Primary Care | Admitting: Primary Care

## 2024-03-24 DIAGNOSIS — Z3201 Encounter for pregnancy test, result positive: Secondary | ICD-10-CM | POA: Insufficient documentation

## 2024-04-07 ENCOUNTER — Ambulatory Visit
Admission: RE | Admit: 2024-04-07 | Discharge: 2024-04-07 | Disposition: A | Discharge status: Home / Self Care | Source: Ambulatory Visit | Attending: Emergency Medicine | Admitting: Emergency Medicine

## 2024-04-07 VITALS — BP 100/67 | HR 75 | Temp 98.1°F | Resp 18

## 2024-04-07 DIAGNOSIS — R350 Frequency of micturition: Secondary | ICD-10-CM | POA: Insufficient documentation

## 2024-04-07 LAB — POCT URINE DIPSTICK
Bilirubin, UA: NEGATIVE
Glucose, UA: NEGATIVE mg/dL
Ketones, POC UA: NEGATIVE mg/dL
Nitrite, UA: NEGATIVE
POC PROTEIN,UA: 100 — AB
Spec Grav, UA: 1.02 (ref 1.010–1.025)
Urobilinogen, UA: 0.2 U/dL
pH, UA: 6 (ref 5.0–8.0)

## 2024-04-07 LAB — POCT URINE PREGNANCY: Preg Test, Ur: POSITIVE — AB

## 2024-04-07 MED ORDER — CEPHALEXIN 500 MG PO CAPS: 500.0000 mg | ORAL_CAPSULE | Freq: Two times a day (BID) | ORAL | 0 refills | Status: AC

## 2024-04-07 NOTE — ED Provider Notes (Signed)
 Tracey Ray    CSN: [id number] Arrival date & time: 04/07/24  1733      History   Chief Complaint Chief Complaint  Patient presents with   Urinary Frequency    Entered by patient    HPI Tracey Ray is a 28 y.o. female.   Patient presents urinary frequency, dysuria, lower abdominal pressure and hematuria beginning 7 days ago.  Has not attempted treatment.  Denies vaginal symptoms or flank pain.  Past Medical History:  Diagnosis Date   History of anemia    History of chlamydia    History of urinary tract infection    Migraines    Normal labor 01/30/2017   Supervision of normal pregnancy, antepartum 11/17/2016      Clinic [facility] Prenatal Labs Dating LMP Blood type: O/Positive/-- (01/31 0000)  Genetic Screen 1 Screen:    AFP:     Quad:     NIPS: Antibody:Negative (01/31 0000) Anatomic US   normal Rubella: Nonimmune (01/31 0000) GTT  Third trimester: normal 1 hour RPR: Nonreactive (01/31 0000)  Flu vaccine  2018 HBsAg: Negative (01/31 0000)  TDaP vaccine   3/18                                             There are no active problems to display for this patient.   Past Surgical History:  Procedure Laterality Date   Denies surgical history      OB History     Gravida  1   Para  1   Term  1   Preterm      AB      Living  1      SAB      IAB      Ectopic      Multiple  0   Live Births  1            Home Medications    Prior to Admission medications   Medication Sig Start Date End Date Taking? Authorizing Provider  cephALEXin  (KEFLEX ) 500 MG capsule Take 1 capsule (500 mg total) by mouth 2 (two) times daily for 5 days. 04/07/24 04/12/24 Yes Ash Mcelwain R, NP  ibuprofen  (ADVIL ,MOTRIN ) 200 MG tablet Take 400 mg by mouth every 6 (six) hours as needed for moderate pain. Patient not taking: Reported on 02/05/2021    [provider]  MOUNJARO 5 MG/0.5ML Pen  02/09/24   [provider]  PRESCRIPTION MEDICATION  1 Units by Implant route.    [provider]    Family History Family History  Problem Relation Age of Onset   Cancer Maternal Grandmother    Diabetes Paternal Grandmother    Diabetes Maternal Grandfather    Healthy Father    Diabetes Mother    Hypertension Mother    Healthy Son     Social History Social History   Tobacco Use   Smoking status: Never   Smokeless tobacco: Never   Tobacco comments:    Occasional cigarette use when was smoking.  Vaping Use   Vaping status: Never Used  Substance Use Topics   Alcohol use: Yes    Comment: occ   Drug use: Not Currently    Types: Marijuana    Comment: Last marijuana use one year ago in 2021.     Allergies   Patient has no known allergies.  Review of Systems Review of Systems   Physical Exam Triage Vital Signs ED Triage Vitals  Encounter Vitals Group     BP 04/07/24 1813 100/67     Girls Systolic BP Percentile --      Girls Diastolic BP Percentile --      Boys Systolic BP Percentile --      Boys Diastolic BP Percentile --      Pulse Rate 04/07/24 1813 75     Resp 04/07/24 1813 18     Temp 04/07/24 1813 98.1 F (36.7 C)     Temp Source 04/07/24 1813 Oral     SpO2 04/07/24 1811 98 %     Weight --      Height --      Head Circumference --      Peak Flow --      Pain Score 04/07/24 1811 0     Pain Loc --      Pain Education --      Exclude from Growth Chart --    No data found.  Updated Vital Signs BP 100/67 (BP Location: Left Arm)   Pulse 75   Temp 98.1 F (36.7 C) (Oral)   Resp 18   SpO2 98%   Visual Acuity Right Eye Distance:   Left Eye Distance:   Bilateral Distance:    Right Eye Near:   Left Eye Near:    Bilateral Near:     Physical Exam Constitutional:      Appearance: Normal appearance.  Eyes:     Extraocular Movements: Extraocular movements intact.  Pulmonary:     Effort: Pulmonary effort is normal.  Abdominal:     Tenderness: There is no abdominal tenderness. There is  no right CVA tenderness, left CVA tenderness or guarding.  Neurological:     Mental Status: She is alert and oriented to person, place, and time. Mental status is at baseline.      UC Treatments / Results  Labs (all labs ordered are listed, but only abnormal results are displayed) Labs Reviewed  POCT URINE DIPSTICK - Abnormal; Notable for the following components:      Result Value   Clarity, UA cloudy (*)    Blood, UA moderate (*)    POC PROTEIN,UA =100 (*)    Leukocytes, UA Small (1+) (*)    All other components within normal limits  POCT URINE PREGNANCY - Abnormal; Notable for the following components:   Preg Test, Ur Positive (*)    All other components within normal limits  URINE CULTURE    EKG   Radiology No results found.  Procedures Procedures (including critical care time)  Medications Ordered in UC Medications - No data to display  Initial Impression / Assessment and Plan / UC Course  I have reviewed the triage vital signs and the nursing notes.  Pertinent labs & imaging results that were available during my care of the patient were reviewed by me and considered in my medical decision making (see chart for details).  Urinary frequency  Urinalysis showed leukocytes, negative for nitrates, sent for culture empirically placed on cephalexin , endorses last menstrual period 1-1/2 weeks ago, urine pregnancy test however positive, discussed findings, recent miscarriage levels most likely elevated still due to this but advised to notify her gynecologist for further evaluation, ultrasound completed on 03/24/2024 was negative, recommend over-the-counter medications and nonpharmacological measures, may follow-up with urgent care as needed Final Clinical Impressions(s) / UC Diagnoses   Final diagnoses:  Urinary  frequency     Discharge Instructions      Your urinalysis shows Dontrae Morini blood cells not show any bacteria at this time your urine will be sent to the lab to  determine exactly which bacteria is present, if any changes need to be made to your medications you will be notified  Begin use of keflex  twice daily for 5 days  You may use over-the-counter Pyridium ( azo, cystex) to help minimize your symptoms until antibiotic removes bacteria, this medication will turn your urine orange  Increase your fluid intake through use of water  As always practice good hygiene, wiping front to back and avoidance of scented vaginal products to prevent further irritation  If symptoms continue to persist after use of medication or recur please follow-up with urgent care or your primary doctor as needed    ED Prescriptions     Medication Sig Dispense Auth. Provider   cephALEXin  (KEFLEX ) 500 MG capsule Take 1 capsule (500 mg total) by mouth 2 (two) times daily for 5 days. 10 capsule Brinkley Peet R, NP      PDMP not reviewed this encounter.   Teresa Shelba SAUNDERS, NP 04/07/24 [phone]

## 2024-04-07 NOTE — ED Triage Notes (Signed)
 Patient here for urinary frequency x 5 days ago. Patient did not use anything for symptoms.

## 2024-04-07 NOTE — Discharge Instructions (Addendum)
 Your urinalysis shows Tracey Ray blood cells not show any bacteria at this time your urine will be sent to the lab to determine exactly which bacteria is present, if any changes need to be made to your medications you will be notified  Begin use of keflex  twice daily for 5 days  You may use over-the-counter Pyridium ( azo, cystex) to help minimize your symptoms until antibiotic removes bacteria, this medication will turn your urine orange  Increase your fluid intake through use of water  As always practice good hygiene, wiping front to back and avoidance of scented vaginal products to prevent further irritation  If symptoms continue to persist after use of medication or recur please follow-up with urgent care or your primary doctor as needed

## 2024-04-09 LAB — URINE CULTURE: Culture: >=100000 — AB

## 2024-04-11 ENCOUNTER — Ambulatory Visit (HOSPITAL_COMMUNITY): Payer: Self-pay

## 2024-05-27 ENCOUNTER — Ambulatory Visit
Admission: RE | Admit: 2024-05-27 | Discharge: 2024-05-27 | Disposition: A | Discharge status: Home / Self Care | Source: Ambulatory Visit | Attending: Emergency Medicine | Admitting: Emergency Medicine

## 2024-05-27 VITALS — BP 105/75 | HR 86 | Temp 98.1°F | Resp 18

## 2024-05-27 DIAGNOSIS — L299 Pruritus, unspecified: Secondary | ICD-10-CM

## 2024-05-27 DIAGNOSIS — H9203 Otalgia, bilateral: Secondary | ICD-10-CM

## 2024-05-27 MED ORDER — CIPROFLOXACIN-DEXAMETHASONE 0.3-0.1 % OT SUSP: 4.0000 [drp] | Freq: Two times a day (BID) | OTIC | 0 refills | Status: AC

## 2024-05-27 NOTE — Discharge Instructions (Signed)
 Today you are evaluated for itching pain and pressure to your ears, on exam there are no signs of infection such as redness swelling or drainage however as her symptoms have persisted for several weeks we will provide you with an antibiotic to see if this gives you relief  Place 4 drops of Ciprodex  into the ears every morning and every evening for 7 days, this is a mixture of antibiotic and a steroid  To prevent further irritation would advised against any ear cleaning  May help warm compresses or take Tylenol  and Motrin  for comfort  If symptoms continue to persist you may follow-up with the ear nose and throat specialist whose information is on from page

## 2024-05-27 NOTE — ED Provider Notes (Signed)
 Tracey Ray    CSN: [id number] Arrival date & time: 05/27/24  1331      History   Chief Complaint Chief Complaint  Patient presents with   Ear Drainage    Ear irritation and pain - Entered by patient    HPI Tracey Ray is a 28 y.o. female.   Patient presents for evaluation of bilateral ear drainage and pruritus with intermittent pressure and pain present for 6 weeks.  Was evaluated at a different urgent care approximately 6 weeks ago, prescribed Zyrtec, endorses no improvement with symptoms.  Denies injury or trauma to the ear, decreased hearing, nasal congestion, fever or URI symptoms.  Has attempted no additional treatment at home.  Past Medical History:  Diagnosis Date   History of anemia    History of chlamydia    History of urinary tract infection    Migraines    Normal labor 01/30/2017   Supervision of normal pregnancy, antepartum 11/17/2016      Clinic [facility] Prenatal Labs Dating LMP Blood type: O/Positive/-- (01/31 0000)  Genetic Screen 1 Screen:    AFP:     Quad:     NIPS: Antibody:Negative (01/31 0000) Anatomic US   normal Rubella: Nonimmune (01/31 0000) GTT  Third trimester: normal 1 hour RPR: Nonreactive (01/31 0000)  Flu vaccine  2018 HBsAg: Negative (01/31 0000)  TDaP vaccine   3/18                                             There are no active problems to display for this patient.   Past Surgical History:  Procedure Laterality Date   Denies surgical history      OB History     Gravida  1   Para  1   Term  1   Preterm      AB      Living  1      SAB      IAB      Ectopic      Multiple  0   Live Births  1            Home Medications    Prior to Admission medications   Medication Sig Start Date End Date Taking? Authorizing Provider  ciprofloxacin -dexamethasone  (CIPRODEX ) OTIC suspension Place 4 drops into both ears 2 (two) times daily. 05/27/24  Yes Amere Bricco R, NP  ibuprofen  (ADVIL ,MOTRIN )  200 MG tablet Take 400 mg by mouth every 6 (six) hours as needed for moderate pain. Patient not taking: Reported on 02/05/2021    [provider]  MOUNJARO 5 MG/0.5ML Pen  02/09/24   [provider]  PRESCRIPTION MEDICATION 1 Units by Implant route.    [provider]    Family History Family History  Problem Relation Age of Onset   Cancer Maternal Grandmother    Diabetes Paternal Grandmother    Diabetes Maternal Grandfather    Healthy Father    Diabetes Mother    Hypertension Mother    Healthy Son     Social History Social History   Tobacco Use   Smoking status: Never   Smokeless tobacco: Never   Tobacco comments:    Occasional cigarette use when was smoking.  Vaping Use   Vaping status: Never Used  Substance Use Topics   Alcohol use: Yes    Comment: occ  Drug use: Not Currently    Types: Marijuana    Comment: Last marijuana use one year ago in 2021.     Allergies   Patient has no known allergies.   Review of Systems Review of Systems   Physical Exam Triage Vital Signs ED Triage Vitals  Encounter Vitals Group     BP 05/27/24 1346 105/75     Girls Systolic BP Percentile --      Girls Diastolic BP Percentile --      Boys Systolic BP Percentile --      Boys Diastolic BP Percentile --      Pulse Rate 05/27/24 1346 86     Resp 05/27/24 1346 18     Temp 05/27/24 1346 98.1 F (36.7 C)     Temp Source 05/27/24 1346 Oral     SpO2 05/27/24 1346 99 %     Weight --      Height --      Head Circumference --      Peak Flow --      Pain Score 05/27/24 1342 0     Pain Loc --      Pain Education --      Exclude from Growth Chart --    No data found.  Updated Vital Signs BP 105/75 (BP Location: Left Arm)   Pulse 86   Temp 98.1 F (36.7 C) (Oral)   Resp 18   LMP 04/23/2024 (Exact Date)   SpO2 99%   Visual Acuity Right Eye Distance:   Left Eye Distance:   Bilateral Distance:    Right Eye Near:   Left Eye Near:    Bilateral  Near:     Physical Exam Constitutional:      Appearance: Normal appearance.  HENT:     Right Ear: Tympanic membrane, ear canal and external ear normal.     Left Ear: Tympanic membrane, ear canal and external ear normal.  Eyes:     Extraocular Movements: Extraocular movements intact.  Pulmonary:     Effort: Pulmonary effort is normal.  Neurological:     Mental Status: She is alert and oriented to person, place, and time.      UC Treatments / Results  Labs (all labs ordered are listed, but only abnormal results are displayed) Labs Reviewed - No data to display  EKG   Radiology No results found.  Procedures Procedures (including critical care time)  Medications Ordered in UC Medications - No data to display  Initial Impression / Assessment and Plan / UC Course  I have reviewed the triage vital signs and the nursing notes.  Pertinent labs & imaging results that were available during my care of the patient were reviewed by me and considered in my medical decision making (see chart for details).  Otalgia of both ears, ear itching  No abnormality noted on exam, discussed this with patient, empirically placed on Ciprodex  as symptoms have been sustained greater than 1 month, recommended against ear cleaning and advised to monitor, walking referral given to ear nose and throat Final Clinical Impressions(s) / UC Diagnoses   Final diagnoses:  Otalgia of both ears  Ear itching     Discharge Instructions      Today you are evaluated for itching pain and pressure to your ears, on exam there are no signs of infection such as redness swelling or drainage however as her symptoms have persisted for several weeks we will provide you with an antibiotic to see if  this gives you relief  Place 4 drops of Ciprodex  into the ears every morning and every evening for 7 days, this is a mixture of antibiotic and a steroid  To prevent further irritation would advised against any ear  cleaning  May help warm compresses or take Tylenol  and Motrin  for comfort  If symptoms continue to persist you may follow-up with the ear nose and throat specialist whose information is on from page    ED Prescriptions     Medication Sig Dispense Auth. Provider   ciprofloxacin -dexamethasone  (CIPRODEX ) OTIC suspension Place 4 drops into both ears 2 (two) times daily. 7.5 mL Teresa Shelba SAUNDERS, NP      PDMP not reviewed this encounter.   Teresa Shelba SAUNDERS, NP 05/27/24 1419

## 2024-05-27 NOTE — ED Triage Notes (Signed)
 Patient reports ear drainage and itching in ears for 4 weeks. Patient seen primary doctor fro same 6 weeks ago. Patient states it got better but returned 4 weeks ago. Patient has not taken anything for symptoms.
